# Patient Record
Sex: Female | Born: 1950 | Race: White | Hispanic: No | Marital: Married | Smoking: Never smoker
Health system: Southern US, Community
[De-identification: ages and names within clinical notes are randomized; demographics above are authoritative.]

## PROBLEM LIST (undated history)

## (undated) DIAGNOSIS — I10 Essential (primary) hypertension: Secondary | ICD-10-CM

## (undated) DIAGNOSIS — E785 Hyperlipidemia, unspecified: Secondary | ICD-10-CM

## (undated) DIAGNOSIS — Z9889 Other specified postprocedural states: Secondary | ICD-10-CM

## (undated) DIAGNOSIS — K219 Gastro-esophageal reflux disease without esophagitis: Secondary | ICD-10-CM

## (undated) HISTORY — DX: Hyperlipidemia, unspecified: E78.5

## (undated) HISTORY — DX: Gastro-esophageal reflux disease without esophagitis: K21.9

## (undated) HISTORY — DX: Other specified postprocedural states: Z98.890

## (undated) HISTORY — PX: KNEE ARTHROSCOPY W/ MENISCAL REPAIR: SHX1877

## (undated) HISTORY — PX: WISDOM TOOTH EXTRACTION: SHX21

## (undated) HISTORY — DX: Essential (primary) hypertension: I10

## (undated) HISTORY — PX: BLADDER SUSPENSION: SHX72

---

## 2000-12-11 ENCOUNTER — Encounter: Admission: RE | Admit: 2000-12-11 | Discharge: 2000-12-11 | Payer: Self-pay | Admitting: Family Medicine

## 2000-12-11 ENCOUNTER — Encounter: Payer: Self-pay | Admitting: Family Medicine

## 2002-07-07 ENCOUNTER — Encounter: Payer: Self-pay | Admitting: Family Medicine

## 2002-07-07 ENCOUNTER — Encounter: Admission: RE | Admit: 2002-07-07 | Discharge: 2002-07-07 | Payer: Self-pay | Admitting: Family Medicine

## 2003-10-31 ENCOUNTER — Ambulatory Visit (HOSPITAL_COMMUNITY): Admission: RE | Admit: 2003-10-31 | Discharge: 2003-10-31 | Payer: Self-pay | Admitting: Obstetrics & Gynecology

## 2004-10-02 ENCOUNTER — Encounter: Admission: RE | Admit: 2004-10-02 | Discharge: 2004-10-02 | Payer: Self-pay | Admitting: Gastroenterology

## 2004-11-18 ENCOUNTER — Ambulatory Visit (HOSPITAL_COMMUNITY): Admission: RE | Admit: 2004-11-18 | Discharge: 2004-11-18 | Payer: Self-pay | Admitting: Gastroenterology

## 2005-04-03 ENCOUNTER — Ambulatory Visit (HOSPITAL_COMMUNITY): Admission: RE | Admit: 2005-04-03 | Discharge: 2005-04-03 | Payer: Self-pay | Admitting: Family Medicine

## 2006-04-02 ENCOUNTER — Ambulatory Visit (HOSPITAL_COMMUNITY): Admission: RE | Admit: 2006-04-02 | Discharge: 2006-04-02 | Payer: Self-pay | Admitting: Family Medicine

## 2006-04-09 ENCOUNTER — Ambulatory Visit (HOSPITAL_COMMUNITY): Admission: RE | Admit: 2006-04-09 | Discharge: 2006-04-09 | Payer: Self-pay | Admitting: Family Medicine

## 2007-04-12 ENCOUNTER — Ambulatory Visit (HOSPITAL_COMMUNITY): Admission: RE | Admit: 2007-04-12 | Discharge: 2007-04-12 | Payer: Self-pay | Admitting: Family Medicine

## 2008-10-26 ENCOUNTER — Ambulatory Visit (HOSPITAL_COMMUNITY): Admission: RE | Admit: 2008-10-26 | Discharge: 2008-10-26 | Payer: Self-pay | Admitting: Family Medicine

## 2010-02-22 ENCOUNTER — Emergency Department (HOSPITAL_COMMUNITY): Admission: EM | Admit: 2010-02-22 | Discharge: 2010-02-23 | Payer: Self-pay | Admitting: Emergency Medicine

## 2010-02-28 ENCOUNTER — Encounter: Admission: RE | Admit: 2010-02-28 | Discharge: 2010-02-28 | Payer: Self-pay | Admitting: Family Medicine

## 2010-08-05 ENCOUNTER — Other Ambulatory Visit: Payer: Self-pay

## 2010-08-05 DIAGNOSIS — Z1231 Encounter for screening mammogram for malignant neoplasm of breast: Secondary | ICD-10-CM

## 2010-08-14 ENCOUNTER — Ambulatory Visit (HOSPITAL_COMMUNITY)
Admission: RE | Admit: 2010-08-14 | Discharge: 2010-08-14 | Disposition: A | Discharge status: Home / Self Care | Payer: BC Managed Care – PPO | Source: Ambulatory Visit | Attending: Family Medicine | Admitting: Family Medicine

## 2010-08-14 DIAGNOSIS — Z1231 Encounter for screening mammogram for malignant neoplasm of breast: Secondary | ICD-10-CM

## 2010-08-19 ENCOUNTER — Other Ambulatory Visit: Payer: Self-pay | Admitting: *Deleted

## 2010-08-19 DIAGNOSIS — R928 Other abnormal and inconclusive findings on diagnostic imaging of breast: Secondary | ICD-10-CM

## 2010-08-21 ENCOUNTER — Other Ambulatory Visit: Payer: Self-pay | Admitting: Family Medicine

## 2010-08-21 DIAGNOSIS — R928 Other abnormal and inconclusive findings on diagnostic imaging of breast: Secondary | ICD-10-CM

## 2010-08-27 ENCOUNTER — Ambulatory Visit
Admission: RE | Admit: 2010-08-27 | Discharge: 2010-08-27 | Disposition: A | Discharge status: Home / Self Care | Payer: BC Managed Care – PPO | Source: Ambulatory Visit | Attending: *Deleted | Admitting: *Deleted

## 2010-08-27 DIAGNOSIS — R928 Other abnormal and inconclusive findings on diagnostic imaging of breast: Secondary | ICD-10-CM

## 2010-09-13 LAB — COMPREHENSIVE METABOLIC PANEL
Alkaline Phosphatase: 60 U/L (ref 39–117)
BUN: 17 mg/dL (ref 6–23)
CO2: 24 mEq/L (ref 19–32)
GFR calc non Af Amer: 45 mL/min — ABNORMAL LOW (ref >60)
Total Bilirubin: 0.4 mg/dL (ref 0.3–1.2)
Total Protein: 6.8 g/dL (ref 6.0–8.3)

## 2010-09-13 LAB — POCT I-STAT, CHEM 8
Chloride: 106 mEq/L (ref 96–112)
Potassium: 3 mEq/L — ABNORMAL LOW (ref 3.5–5.1)
Sodium: 140 mEq/L (ref 135–145)

## 2010-09-13 LAB — CBC
HCT: 35.5 % — ABNORMAL LOW (ref 36.0–46.0)
Hemoglobin: 12.4 g/dL (ref 12.0–15.0)
MCHC: 35.3 g/dL (ref 30.0–36.0)
MCV: 88.8 fL (ref 78.0–100.0)
RBC: 3.95 MIL/uL (ref 3.87–5.11)
RDW: 12.2 % (ref 11.5–15.5)
WBC: 11.8 10*3/uL — ABNORMAL HIGH (ref 4.0–10.5)
WBC: 13.1 10*3/uL — ABNORMAL HIGH (ref 4.0–10.5)

## 2010-09-13 LAB — GLUCOSE, CAPILLARY: Glucose-Capillary: 142 mg/dL — ABNORMAL HIGH (ref 70–99)

## 2010-09-13 LAB — PROTIME-INR
INR: 0.91 (ref 0.00–1.49)
Prothrombin Time: 12.5 seconds (ref 11.6–15.2)

## 2010-09-13 LAB — DIFFERENTIAL
Basophils Absolute: 0 10*3/uL (ref 0.0–0.1)
Lymphocytes Relative: 18 % (ref 12–46)
Lymphs Abs: 2.3 10*3/uL (ref 0.7–4.0)
Monocytes Absolute: 0.8 10*3/uL (ref 0.1–1.0)
Neutro Abs: 10 10*3/uL — ABNORMAL HIGH (ref 1.7–7.7)

## 2010-09-13 LAB — LACTIC ACID, PLASMA: Lactic Acid, Venous: 2.5 mmol/L — ABNORMAL HIGH (ref 0.5–2.2)

## 2010-09-13 LAB — APTT: aPTT: 27 seconds (ref 24–37)

## 2010-11-01 ENCOUNTER — Other Ambulatory Visit (HOSPITAL_COMMUNITY): Payer: Self-pay | Admitting: Family Medicine

## 2010-11-01 DIAGNOSIS — Z1231 Encounter for screening mammogram for malignant neoplasm of breast: Secondary | ICD-10-CM

## 2010-11-15 NOTE — Op Note (Signed)
NAME:  Jenna Bentley, Jenna Bentley                ACCOUNT NO.:  [id number]   MEDICAL RECORD NO.:  [id number]          PATIENT TYPE:  AMB   LOCATION:  ENDO                         FACILITY:  [facility]   PHYSICIAN:  Anselmo Rod, M.D.  DATE OF BIRTH:  03-28-1951   DATE OF PROCEDURE:  11/18/2004  DATE OF DISCHARGE:                                 OPERATIVE REPORT   PROCEDURE PERFORMED:  Screening colonoscopy.   ENDOSCOPIST:  Charna Elizabeth, M.D.   INSTRUMENT USED:  Olympus video colonoscope.   INDICATIONS FOR PROCEDURE:  A 60 year old white female with a history of  change in bowel habits, abdominal pain undergoing screening colonoscopy to  rule out colonic polyps, masses, etc.   PREPROCEDURE PREPARATION:  Informed consent was procured from the patient.  The patient was fasted for eight hours prior to the procedure and prepped  with a bottle of magnesium citrate and a gallon of GoLYTELY the night prior  to the procedure.  The risks and benefits of the procedure including a 10%  miss rate for colon polyps or cancers was discussed with the patient as  well.   PREPROCEDURE PHYSICAL:  The patient had stable vital signs.  Neck supple.  Chest clear to auscultation.  S1 and S2 regular.  Abdomen soft with normal  bowel sounds.   DESCRIPTION OF PROCEDURE:  The patient was placed in left lateral decubitus  position and sedated with 75 mg of Demerol and 7.5 mg of Versed in slow  incremental doses.  Once the patient was adequately sedated and maintained  on low flow oxygen and continuous cardiac monitoring, the Olympus video  colonoscope was advanced from the rectum to the cecum.  The appendicular  orifice and ileocecal valve were clearly visualized and photographed.  The  terminal ileum appeared healthy and without lesions. Small internal  hemorrhoids were seen on retroflexion.  No masses, polyps, erosions,  ulcerations or diverticula were appreciated.  The patient tolerated the  procedure well without  complication.   IMPRESSION:  Normal colonoscopy up to terminal ileum except for small  internal hemorrhoids.  No masses, polyps or diverticula seen.   RECOMMENDATIONS:  1. Continue high fiber diet with liberal fluid intake.  2. Repeat colonoscopy in the next five years unless the patient develops      any abnormal symptoms in the interim.  3. Outpatient followup in the next two weeks or earlier if need be.        JNM/MEDQ  D:  11/18/2004  T:  11/18/2004  Job:  [id number]   cc:   Marjory Lies, M.D.  [address]  [place]  [place] [address]  Fax: [phone]

## 2011-02-26 ENCOUNTER — Other Ambulatory Visit: Payer: Self-pay | Admitting: Family Medicine

## 2011-02-26 DIAGNOSIS — N63 Unspecified lump in unspecified breast: Secondary | ICD-10-CM

## 2011-03-04 ENCOUNTER — Ambulatory Visit
Admission: RE | Admit: 2011-03-04 | Discharge: 2011-03-04 | Disposition: A | Discharge status: Home / Self Care | Payer: BC Managed Care – PPO | Source: Ambulatory Visit | Attending: Family Medicine | Admitting: Family Medicine

## 2011-03-04 DIAGNOSIS — N63 Unspecified lump in unspecified breast: Secondary | ICD-10-CM

## 2011-10-06 ENCOUNTER — Other Ambulatory Visit: Payer: Self-pay | Admitting: Family Medicine

## 2011-10-06 DIAGNOSIS — Z1231 Encounter for screening mammogram for malignant neoplasm of breast: Secondary | ICD-10-CM

## 2011-10-21 ENCOUNTER — Ambulatory Visit: Payer: BC Managed Care – PPO

## 2011-10-28 ENCOUNTER — Ambulatory Visit
Admission: RE | Admit: 2011-10-28 | Discharge: 2011-10-28 | Disposition: A | Discharge status: Home / Self Care | Payer: BC Managed Care – PPO | Source: Ambulatory Visit | Attending: Family Medicine | Admitting: Family Medicine

## 2011-10-28 DIAGNOSIS — Z1231 Encounter for screening mammogram for malignant neoplasm of breast: Secondary | ICD-10-CM

## 2013-01-03 ENCOUNTER — Other Ambulatory Visit: Payer: Self-pay

## 2013-01-03 DIAGNOSIS — Z1231 Encounter for screening mammogram for malignant neoplasm of breast: Secondary | ICD-10-CM

## 2013-01-26 ENCOUNTER — Ambulatory Visit
Admission: RE | Admit: 2013-01-26 | Discharge: 2013-01-26 | Disposition: A | Discharge status: Home / Self Care | Payer: BC Managed Care – PPO | Source: Ambulatory Visit

## 2013-01-26 DIAGNOSIS — Z1231 Encounter for screening mammogram for malignant neoplasm of breast: Secondary | ICD-10-CM

## 2013-06-30 HISTORY — PX: ROTATOR CUFF REPAIR: SHX139

## 2013-12-28 NOTE — Telephone Encounter (Signed)
This encounter was created in error - please disregard.

## 2014-03-09 ENCOUNTER — Other Ambulatory Visit: Payer: Self-pay

## 2014-03-09 DIAGNOSIS — Z1231 Encounter for screening mammogram for malignant neoplasm of breast: Secondary | ICD-10-CM

## 2014-03-22 ENCOUNTER — Encounter (INDEPENDENT_AMBULATORY_CARE_PROVIDER_SITE_OTHER): Payer: Self-pay

## 2014-03-22 ENCOUNTER — Ambulatory Visit
Admission: RE | Admit: 2014-03-22 | Discharge: 2014-03-22 | Disposition: A | Discharge status: Home / Self Care | Payer: BC Managed Care – PPO | Source: Ambulatory Visit

## 2014-03-22 DIAGNOSIS — Z1231 Encounter for screening mammogram for malignant neoplasm of breast: Secondary | ICD-10-CM

## 2014-05-16 ENCOUNTER — Ambulatory Visit: Payer: BC Managed Care – PPO | Attending: Orthopedic Surgery | Admitting: Physical Therapy

## 2014-05-16 DIAGNOSIS — M25519 Pain in unspecified shoulder: Secondary | ICD-10-CM | POA: Insufficient documentation

## 2014-05-16 DIAGNOSIS — M25619 Stiffness of unspecified shoulder, not elsewhere classified: Secondary | ICD-10-CM | POA: Insufficient documentation

## 2014-06-13 ENCOUNTER — Ambulatory Visit: Payer: BC Managed Care – PPO | Attending: Orthopedic Surgery | Admitting: Physical Therapy

## 2014-06-13 DIAGNOSIS — M25619 Stiffness of unspecified shoulder, not elsewhere classified: Secondary | ICD-10-CM | POA: Insufficient documentation

## 2014-06-13 DIAGNOSIS — M25519 Pain in unspecified shoulder: Secondary | ICD-10-CM | POA: Diagnosis not present

## 2014-06-16 ENCOUNTER — Ambulatory Visit: Payer: BC Managed Care – PPO | Admitting: Physical Therapy

## 2014-06-16 DIAGNOSIS — M25519 Pain in unspecified shoulder: Secondary | ICD-10-CM | POA: Diagnosis not present

## 2014-06-19 ENCOUNTER — Ambulatory Visit: Payer: BC Managed Care – PPO | Admitting: *Deleted

## 2014-06-19 DIAGNOSIS — M25519 Pain in unspecified shoulder: Secondary | ICD-10-CM | POA: Diagnosis not present

## 2014-06-21 ENCOUNTER — Ambulatory Visit: Payer: BC Managed Care – PPO | Admitting: *Deleted

## 2014-06-21 DIAGNOSIS — M25519 Pain in unspecified shoulder: Secondary | ICD-10-CM | POA: Diagnosis not present

## 2014-06-27 ENCOUNTER — Ambulatory Visit: Payer: BC Managed Care – PPO | Admitting: Physical Therapy

## 2014-06-27 DIAGNOSIS — M25519 Pain in unspecified shoulder: Secondary | ICD-10-CM | POA: Diagnosis not present

## 2014-06-29 ENCOUNTER — Ambulatory Visit: Payer: BC Managed Care – PPO | Admitting: *Deleted

## 2014-06-29 DIAGNOSIS — M25519 Pain in unspecified shoulder: Secondary | ICD-10-CM | POA: Diagnosis not present

## 2014-07-04 ENCOUNTER — Ambulatory Visit: Payer: BC Managed Care – PPO | Attending: Orthopedic Surgery | Admitting: Physical Therapy

## 2014-07-04 DIAGNOSIS — M25619 Stiffness of unspecified shoulder, not elsewhere classified: Secondary | ICD-10-CM | POA: Diagnosis not present

## 2014-07-04 DIAGNOSIS — M25519 Pain in unspecified shoulder: Secondary | ICD-10-CM | POA: Insufficient documentation

## 2014-07-06 ENCOUNTER — Encounter: Payer: BC Managed Care – PPO | Admitting: *Deleted

## 2014-07-06 ENCOUNTER — Ambulatory Visit: Payer: BC Managed Care – PPO | Admitting: Physical Therapy

## 2014-07-06 DIAGNOSIS — M25519 Pain in unspecified shoulder: Secondary | ICD-10-CM | POA: Diagnosis not present

## 2014-07-11 ENCOUNTER — Ambulatory Visit: Payer: BC Managed Care – PPO | Admitting: *Deleted

## 2014-07-11 DIAGNOSIS — M25519 Pain in unspecified shoulder: Secondary | ICD-10-CM | POA: Diagnosis not present

## 2014-07-13 ENCOUNTER — Encounter: Payer: BC Managed Care – PPO | Admitting: Physical Therapy

## 2014-07-17 ENCOUNTER — Ambulatory Visit: Payer: BC Managed Care – PPO | Admitting: Physical Therapy

## 2014-07-17 DIAGNOSIS — M25519 Pain in unspecified shoulder: Secondary | ICD-10-CM | POA: Diagnosis not present

## 2014-07-20 ENCOUNTER — Ambulatory Visit: Payer: BC Managed Care – PPO | Admitting: Physical Therapy

## 2014-07-20 DIAGNOSIS — M25519 Pain in unspecified shoulder: Secondary | ICD-10-CM | POA: Diagnosis not present

## 2014-07-26 ENCOUNTER — Ambulatory Visit: Payer: BC Managed Care – PPO | Admitting: Physical Therapy

## 2014-07-26 DIAGNOSIS — M25519 Pain in unspecified shoulder: Secondary | ICD-10-CM | POA: Diagnosis not present

## 2014-07-31 ENCOUNTER — Ambulatory Visit: Payer: BC Managed Care – PPO | Attending: Orthopedic Surgery | Admitting: Physical Therapy

## 2014-07-31 DIAGNOSIS — M25519 Pain in unspecified shoulder: Secondary | ICD-10-CM | POA: Insufficient documentation

## 2014-07-31 DIAGNOSIS — M25619 Stiffness of unspecified shoulder, not elsewhere classified: Secondary | ICD-10-CM | POA: Diagnosis not present

## 2014-08-03 ENCOUNTER — Ambulatory Visit: Payer: BC Managed Care – PPO | Admitting: Physical Therapy

## 2014-08-03 DIAGNOSIS — M25519 Pain in unspecified shoulder: Secondary | ICD-10-CM | POA: Diagnosis not present

## 2014-08-08 ENCOUNTER — Ambulatory Visit: Payer: BC Managed Care – PPO | Admitting: *Deleted

## 2014-08-08 DIAGNOSIS — M25519 Pain in unspecified shoulder: Secondary | ICD-10-CM | POA: Diagnosis not present

## 2014-08-10 ENCOUNTER — Ambulatory Visit: Payer: BC Managed Care – PPO | Admitting: *Deleted

## 2014-08-10 DIAGNOSIS — M25519 Pain in unspecified shoulder: Secondary | ICD-10-CM | POA: Diagnosis not present

## 2014-08-17 ENCOUNTER — Encounter: Payer: BC Managed Care – PPO | Admitting: *Deleted

## 2014-08-28 ENCOUNTER — Encounter: Payer: Self-pay | Admitting: Physical Therapy

## 2014-08-28 ENCOUNTER — Ambulatory Visit: Payer: BC Managed Care – PPO | Admitting: Physical Therapy

## 2014-08-28 DIAGNOSIS — S43401A Unspecified sprain of right shoulder joint, initial encounter: Secondary | ICD-10-CM

## 2014-08-28 DIAGNOSIS — M25519 Pain in unspecified shoulder: Secondary | ICD-10-CM | POA: Diagnosis not present

## 2014-08-28 DIAGNOSIS — M25611 Stiffness of right shoulder, not elsewhere classified: Secondary | ICD-10-CM

## 2014-08-28 DIAGNOSIS — Z9889 Other specified postprocedural states: Secondary | ICD-10-CM

## 2014-08-28 NOTE — Therapy (Signed)
[facility] [place], [place], [address] Phone: [phone]   Fax:  [phone]  Physical Therapy Treatment  Patient Details  Name: Jenna Bentley MRN: [id number] Date of Birth: Oct 14, 1950 Referring Provider:  Renette Butters, MD  Encounter Date: 08/28/2014      PT End of Session - 08/28/14 1309    Visit Number 18   Number of Visits 24   Date for PT Re-Evaluation 09/04/14   PT Start Time 1229   PT Stop Time 1314   PT Time Calculation (min) 45 min      History reviewed. No pertinent past medical history.  History reviewed. No pertinent past surgical history.  There were no vitals taken for this visit.  Visit Diagnosis:  Shoulder sprain, right, initial encounter  Shoulder stiffness, right  S/P rotator cuff repair      Subjective Assessment - 08/28/14 1239    Symptoms no pain or difficulty except for reaching behind back   Currently in Pain? No/denies          [facility] PT Assessment - 08/28/14 0001    ROM / Strength   AROM / PROM / Strength AROM;PROM   AROM   Overall AROM  Deficits   Overall AROM Comments (post RCR 06-13-14 88 degrees flex/15 degrees ER   AROM Assessment Site Shoulder   Right/Left Shoulder Right   Right Shoulder Flexion 150 Degrees   Right Shoulder External Rotation 60 Degrees   PROM   Overall PROM  Deficits   PROM Assessment Site Shoulder   Right/Left Shoulder Right   Right Shoulder Flexion 155 Degrees   Right Shoulder External Rotation 70 Degrees                  [facility] Adult PT Treatment/Exercise - 08/28/14 0001    Exercises   Exercises Shoulder   Shoulder Exercises: Pulleys   Flexion 3 minutes   Other Pulley Exercises UE ranger for elevation/circles 2 x 10 each   Shoulder Exercises: Isometric Strengthening   Flexion Theraband   Theraband Level (Flexion) Level 1 (Yellow)   Flexion Limitations 2x10   Extension Theraband   Theraband Level (Extension) Level 1  (Yellow)   Extension Limitations 2x10   External Rotation Theraband   Theraband Level (External Rotation) Level 1 (Yellow)   External Rotation Limitations 2x10   Internal Rotation Theraband   Theraband Level (Internal Rotation) Level 1 (Yellow)   Internal Rotation Limitations 2x10   Shoulder Exercises: Stretch   Corner Stretch 3 reps;30 seconds   Internal Rotation Stretch 20 seconds   Internal Rotation Stretch Limitations towel strech   Manual Therapy   Manual Therapy Passive ROM   Passive ROM low load hold with flex/er                PT Education - 08/28/14 1309    Education provided Yes   Education Details HEP/protocol   Person(s) Educated Patient   Methods Explanation;Demonstration;Handout   Comprehension Verbalized understanding;Returned demonstration             PT Long Term Goals - 08/28/14 1334    PT LONG TERM GOAL #1   Title demonstrate and/or verbalize techniques to reduce the risk of re-injury to include info on: anti-inflammatory (RICE Method)   Status Achieved  06-21-14   PT LONG TERM GOAL #2   Title be independent with advanced HEP   Status On-going  Plan - 08/28/14 1330    Clinical Impression Statement Pt tolerated tx well today with no pain, pt has been using arm actively will all ADL's, educated pt on protocol and limitations. pt understands HEP and protocol.   PT Treatment/Interventions Cryotherapy;Moist Heat;ADLs/Self Care Home Management;Therapeutic activities;Patient/family education;Therapeutic exercise;Passive range of motion;Ultrasound;Electrical Stimulation;Manual techniques   PT Next Visit Plan Cont with POC per PROTOCOL and get updated medication list from pt        Problem List There are no active problems to display for this patient.   Phillips Climes, PTA 08/28/2014, 1:57 PM  [facility] [address] [place], [place], [address] Phone: [phone]    Fax:  [phone]

## 2014-08-28 NOTE — Patient Instructions (Signed)
Strengthening: Resisted Flexion   Hold tubing with left arm at side. Pull forward and up. Move shoulder through pain-free range of motion. Repeat _10-20___ times per set. Do __1-2__ sets per session. Do __1-2__ sessions per day.  http://orth.exer.us/824   Copyright  VHI. All rights reserved.  Strengthening: Resisted Extension   Hold tubing in right hand, arm forward. Pull arm back, elbow straight. Repeat _10-20___ times per set. Do _1-2___ sets per session. Do __1-2__ sessions per day.  http://orth.exer.us/832   Copyright  VHI. All rights reserved.  Strengthening: Resisted External Rotation   Hold tubing in right hand, elbow at side and forearm across body. Rotate forearm out. Repeat __10-20__ times per set. Do __1-2__ sets per session. Do __1-2__ sessions per day.  http://orth.exer.us/828   Copyright  VHI. All rights reserved.  Strengthening: Resisted Internal Rotation   Hold tubing in left hand, elbow at side and forearm out. Rotate forearm in across body. Repeat _10-20___ times per set. Do __1-2__ sets per session. Do __1-2__ sessions per day.  http://orth.exer.us/830   Copyright  VHI. All rights reserved.  Flexibility: Corner Stretch   Standing in corner with hands just above shoulder level, lean forward until a comfortable stretch is felt across chest. Hold _20___ seconds. Repeat ___5-10_ times per set. Do _1-2___ sets per session. Do _1-2___ sessions per day.  http://orth.exer.us/342   Copyright  VHI. All rights reserved.  ROM: Towel Stretch - with Interior Rotation   Pull left arm up behind back by pulling towel up with other arm. Hold _20___ seconds. Repeat __5-0__ times per set. Do __1-2__ sets per session. Do __1-2__ sessions per day.  http://orth.exer.us/888   Copyright  VHI. All rights reserved.

## 2014-09-07 ENCOUNTER — Ambulatory Visit: Payer: BC Managed Care – PPO | Attending: Orthopedic Surgery | Admitting: Physical Therapy

## 2014-09-07 DIAGNOSIS — M25619 Stiffness of unspecified shoulder, not elsewhere classified: Secondary | ICD-10-CM | POA: Diagnosis not present

## 2014-09-07 DIAGNOSIS — R29898 Other symptoms and signs involving the musculoskeletal system: Secondary | ICD-10-CM

## 2014-09-07 DIAGNOSIS — M25519 Pain in unspecified shoulder: Secondary | ICD-10-CM | POA: Diagnosis not present

## 2014-09-07 NOTE — Therapy (Signed)
[facility] [place], [place], [address] Phone: [phone]   Fax:  [phone]  Physical Therapy Treatment  Patient Details  Name: Jenna Bentley MRN: [id number] Date of Birth: [birth year] Referring Provider:  Renette Butters, MD  Encounter Date: 09/07/2014      PT End of Session - 09/07/14 1340    PT Start Time 0102   PT Stop Time 0139   PT Time Calculation (min) 37 min      No past medical history on file.  No past surgical history on file.  There were no vitals filed for this visit.  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   Multiple Pain Sites No      There EX:  UBE @ 120 RPM's x 6 minutes Pulleys x 5 minutes UE Ranger x 5 minutes into flexion and ER  Manual:  12 minutes of left shoulder PROM into flexion, ER and IR.                             PT Long Term Goals - 08/28/14 1334    PT LONG TERM GOAL #1   Title demonstrate and/or verbalize techniques to reduce the risk of re-injury to include info on: anti-inflammatory (RICE Method)   Status Achieved  06-21-14   PT LONG TERM GOAL #2   Title be independent with advanced HEP   Status On-going   PT LONG TERM GOAL #3   Title Active shoulder flexion to 155 degrees   Time 4   Period Weeks   Status New   PT LONG TERM GOAL #4   Title Shoulder strength to 4+/5   Time 4   Period Weeks   Status New               Plan - 09/07/14 1347    PT Next Visit Plan UBE at 120 RPM's; PRE's.   Consulted and Agree with Plan of Care Patient        Problem List There are no active problems to display for this patient.   Roland Prine, [place] MPT 09/07/2014, 1:49 PM  [facility] [address] [place], [place], [address] Phone: [phone]   Fax:  [phone]

## 2014-09-12 ENCOUNTER — Encounter: Payer: Self-pay | Admitting: *Deleted

## 2014-09-12 ENCOUNTER — Ambulatory Visit: Payer: BC Managed Care – PPO | Admitting: *Deleted

## 2014-09-12 DIAGNOSIS — Z9889 Other specified postprocedural states: Secondary | ICD-10-CM

## 2014-09-12 DIAGNOSIS — R29898 Other symptoms and signs involving the musculoskeletal system: Secondary | ICD-10-CM

## 2014-09-12 DIAGNOSIS — S43401A Unspecified sprain of right shoulder joint, initial encounter: Secondary | ICD-10-CM

## 2014-09-12 DIAGNOSIS — M25611 Stiffness of right shoulder, not elsewhere classified: Secondary | ICD-10-CM

## 2014-09-12 DIAGNOSIS — M25519 Pain in unspecified shoulder: Secondary | ICD-10-CM | POA: Diagnosis not present

## 2014-09-12 NOTE — Therapy (Signed)
[facility] [place], [place], [address] Phone: [phone]   Fax:  [phone]  Physical Therapy Treatment  Patient Details  Name: Jenna Bentley MRN: [id number] Date of Birth: [birth year] Referring Provider:  Renette Butters, MD  Encounter Date: 09/12/2014      PT End of Session - 09/12/14 1217    Visit Number 20   Number of Visits 24   Date for PT Re-Evaluation 09/28/14   PT Start Time 2992   PT Stop Time 1131   PT Time Calculation (min) 59 min   Activity Tolerance Patient tolerated treatment well   Behavior During Therapy [facility] for tasks assessed/performed      History reviewed. No pertinent past medical history.  History reviewed. No pertinent past surgical history.  There were no vitals filed for this visit.  Visit Diagnosis:  Weakness of right arm  Shoulder sprain, right, initial encounter  Shoulder stiffness, right  S/P rotator cuff repair      Subjective Assessment - 09/12/14 1150    Symptoms Has been doing outside chores like raking with no pain.   Limitations Lifting   Currently in Pain? No/denies   Multiple Pain Sites No                       [facility] Adult PT Treatment/Exercise - 09/12/14 0001    Shoulder Exercises: Supine   Other Supine Exercises --  chest press 1 lb, supine 3 x 1o   Shoulder Exercises: Sidelying   External Rotation Weight (lbs) --  sdly 1lb external rotation .Marland KitchenMarland Kitchen3 x10   Theraband Level (Shoulder Flexion) Level 1 (Yellow)  Yellow T-band x 30 each ER,IR,protraction, retraction   Shoulder Exercises: Standing   Retraction --  bent rows 1 lb 3 x 10   Other Standing Exercises --  full can no wts....standing 3 x 10   Shoulder Exercises: Pulleys   Flexion --  seated pulley 3 min   Shoulder Exercises: ROM/Strengthening   UBE (Upper Arm Bike) --  6 min UBE 120 rpm 3 min forward, 3 back                     PT Long Term Goals - 08/28/14 1334    PT LONG TERM GOAL #1   Title demonstrate and/or verbalize techniques to reduce the risk of re-injury to include info on: anti-inflammatory (RICE Method)   Status Achieved  06-21-14   PT LONG TERM GOAL #2   Title be independent with advanced HEP   Status On-going   PT LONG TERM GOAL #3   Title Active shoulder flexion to 155 degrees   Time 4   Period Weeks   Status New   PT LONG TERM GOAL #4   Title Shoulder strength to 4+/5   Time 4   Period Weeks   Status New               Plan - 09/12/14 1221    Clinical Impression Statement Progressing with ROM. supine AROM flex 160. full ER......cont PRE and protocol   Pt will benefit from skilled therapeutic intervention in order to improve on the following deficits Decreased strength   Rehab Potential Excellent   PT Frequency 2x / week   PT Duration 8 weeks        Problem List There are no active problems to display for this patient.   Fabienne Bruns P,PTA 09/12/2014, 12:30 PM  [facility] [place], [place], [address] Phone: [phone]   Fax:  [phone]

## 2014-09-12 NOTE — Patient Instructions (Signed)
External Rotation: Single Arm (Cable)   Arm across body, rotate arm away from torso, keeping upper arm against body. Do ____ sets. Complete ____ repetitions.  http://st.exer.us/534   Copyright  VHI. All rights reserved.  External Rotation: Single Arm (Cable)   Arm across body, rotate arm away from torso, keeping upper arm against body. Do _3___ sets. Complete _10___ repetitions.  http://st.exer.us/534   Copyright  VHI. All rights reserved.

## 2014-09-21 ENCOUNTER — Ambulatory Visit: Payer: BC Managed Care – PPO | Admitting: *Deleted

## 2014-09-21 ENCOUNTER — Encounter: Payer: Self-pay | Admitting: *Deleted

## 2014-09-21 DIAGNOSIS — M25611 Stiffness of right shoulder, not elsewhere classified: Secondary | ICD-10-CM

## 2014-09-21 DIAGNOSIS — M25519 Pain in unspecified shoulder: Secondary | ICD-10-CM | POA: Diagnosis not present

## 2014-09-21 DIAGNOSIS — R29898 Other symptoms and signs involving the musculoskeletal system: Secondary | ICD-10-CM

## 2014-09-21 DIAGNOSIS — S43401A Unspecified sprain of right shoulder joint, initial encounter: Secondary | ICD-10-CM

## 2014-09-21 DIAGNOSIS — Z9889 Other specified postprocedural states: Secondary | ICD-10-CM

## 2014-09-21 NOTE — Therapy (Signed)
[facility] [place], [place], [address] Phone: [phone]   Fax:  [phone]  Physical Therapy Evaluation  Patient Details  Name: Jenna Bentley MRN: [id number] Date of Birth: Nov 11, 1950 Referring Provider:  Renette Butters, MD  Encounter Date: 09/21/2014      PT End of Session - 09/21/14 1156    Visit Number 21   Number of Visits 24   Date for PT Re-Evaluation 09/28/14   PT Start Time 1032   PT Stop Time 1127   PT Time Calculation (min) 55 min   Activity Tolerance Patient tolerated treatment well   Behavior During Therapy [facility] for tasks assessed/performed      History reviewed. No pertinent past medical history.  History reviewed. No pertinent past surgical history.  There were no vitals filed for this visit.  Visit Diagnosis:  Weakness of right arm  Shoulder sprain, right, initial encounter  Shoulder stiffness, right  S/P rotator cuff repair      Subjective Assessment - 09/21/14 1150    Symptoms Patient does not complain of pain.  She continues to rake and do yard chores without difficulty. She has the most difficulty reaching behind her back.   Limitations House hold activities   Currently in Pain? No/denies   Multiple Pain Sites No   Multiple Pain Sites No                                    PT Long Term Goals - 08/28/14 1334    PT LONG TERM GOAL #1   Title demonstrate and/or verbalize techniques to reduce the risk of re-injury to include info on: anti-inflammatory (RICE Method)   Status Achieved  06-21-14   PT LONG TERM GOAL #2   Title be independent with advanced HEP   Status On-going   PT LONG TERM GOAL #3   Title Active shoulder flexion to 155 degrees   Time 4   Period Weeks   Status New   PT LONG TERM GOAL #4   Title Shoulder strength to 4+/5   Time 4   Period Weeks   Status New               Plan - 09/21/14 1200    Clinical Impression  Statement Pt AROM supine...160.   full ER.... standing flex ...145.Marland KitchenMarland KitchenProgressing with strength per protocol   Pt will benefit from skilled therapeutic intervention in order to improve on the following deficits Decreased strength;Impaired UE functional use   Rehab Potential Excellent   PT Frequency 2x / week   PT Duration 8 weeks   PT Treatment/Interventions Therapeutic exercise   PT Next Visit Plan PRE per protocol   Consulted and Agree with Plan of Care Patient         Problem List There are no active problems to display for this patient.   Shanna Cisco 09/21/2014, 12:05 PM  [facility] [address] [place], [place], [address] Phone: [phone]   Fax:  [phone]

## 2014-09-26 ENCOUNTER — Ambulatory Visit: Payer: BC Managed Care – PPO | Admitting: Physical Therapy

## 2014-09-26 DIAGNOSIS — Z9889 Other specified postprocedural states: Secondary | ICD-10-CM

## 2014-09-26 DIAGNOSIS — M25519 Pain in unspecified shoulder: Secondary | ICD-10-CM | POA: Diagnosis not present

## 2014-09-26 DIAGNOSIS — R29898 Other symptoms and signs involving the musculoskeletal system: Secondary | ICD-10-CM

## 2014-09-26 DIAGNOSIS — M25611 Stiffness of right shoulder, not elsewhere classified: Secondary | ICD-10-CM

## 2014-09-26 NOTE — Patient Instructions (Signed)
ROM: Towel Stretch - with Interior Rotation   Pull left arm up behind back by pulling towel up with other arm. Hold _30___ seconds. Repeat _3___ times per set. Do _1___ sets per session. Do __2__ sessions per day.  http://orth.exer.us/888   Copyright  VHI. All rights reserved.

## 2014-09-26 NOTE — Therapy (Signed)
[facility] [place], [place], [address] Phone: [phone]   Fax:  [phone]  Physical Therapy Treatment  Patient Details  Name: Jenna Bentley MRN: [id number] Date of Birth: 09-May-1951 Referring Provider:  Renette Butters, MD  Encounter Date: 09/26/2014      PT End of Session - 09/26/14 1446    Visit Number 22   Number of Visits 24   Date for PT Re-Evaluation 09/28/14   PT Start Time 1428   PT Stop Time 1508   PT Time Calculation (min) 40 min   Activity Tolerance Patient tolerated treatment well   Behavior During Therapy [facility] for tasks assessed/performed      No past medical history on file.  No past surgical history on file.  There were no vitals filed for this visit.  Visit Diagnosis:  Weakness of right arm  Shoulder stiffness, right  S/P rotator cuff repair      Subjective Assessment - 09/26/14 1427    Symptoms Patient reports of no pain or soreness. Has appt with MD 09-28-14. Wishes for any exercises that could further her improvement. States that she did good with the weights at the last appointment.   Currently in Pain? No/denies                       [facility] Adult PT Treatment/Exercise - 09/26/14 0001    Exercises   Exercises Shoulder   Shoulder Exercises: Supine   Other Supine Exercises Supine chest press 2# 2 sets of 10 reps   Shoulder Exercises: Standing   Protraction Strengthening;Right;Theraband  30 reps   Theraband Level (Shoulder Protraction) Level 2 (Red)   External Rotation Strengthening;Right;Theraband  30 reps   Theraband Level (Shoulder External Rotation) Level 2 (Red)   Internal Rotation Strengthening;Right;Theraband  30 reps   Theraband Level (Shoulder Internal Rotation) Level 2 (Red)   Row Strengthening;Right;20 reps;Weights   Row Weight (lbs) 2   Retraction Strengthening;Right;Theraband  30 reps   Theraband Level (Shoulder Retraction) Level 2 (Red)   Other  Standing Exercises Full can 2# 20 reps   Shoulder Exercises: Pulleys   Flexion Other (comment)  8 minutes for cool-down   Shoulder Exercises: ROM/Strengthening   UBE (Upper Arm Bike) 90 RPMs x 8 min                PT Education - 09/26/14 1446    Education provided Yes   Education Details HEP: towel IR stretch. Continue other strengthening HEP. Given red theraband.   Person(s) Educated Patient   Methods Explanation;Demonstration;Handout   Comprehension Verbalized understanding;Returned demonstration;Verbal cues required             PT Long Term Goals - 09/26/14 1459    PT LONG TERM GOAL #1   Title demonstrate and/or verbalize techniques to reduce the risk of re-injury to include info on: anti-inflammatory (RICE Method)   Status Achieved   PT LONG TERM GOAL #2   Title be independent with advanced HEP   Status Achieved   PT LONG TERM GOAL #3   Title Active shoulder flexion to 155 degrees   Time 4   Period Weeks   Status On-going   PT LONG TERM GOAL #4   Title Shoulder strength to 4+/5   Time 4   Period Weeks   Status On-going               Plan - 09/26/14 1455    Clinical Impression  Statement Patient demonstrated mild R shoulder elevation during standing full can. Required external focus of attention and tactile cueing to decrease R shoulder compensation. Had no complaints of pain during treatment. Patient denied modalities and stated she would ice at home. Met independence with HEP goal today.    Pt will benefit from skilled therapeutic intervention in order to improve on the following deficits Decreased strength;Impaired UE functional use   Rehab Potential Excellent   PT Frequency 2x / week   PT Duration 8 weeks   PT Treatment/Interventions Therapeutic exercise   PT Next Visit Plan Continue per PT POC. Reassess ROM and strength next visit for MD appointment.   Consulted and Agree with Plan of Care Patient        Problem List There are no active  problems to display for this patient.   Wynelle Fanny, PTA 09/26/2014, 3:14 PM  [facility] [address] [place], [place], [address] Phone: [phone]   Fax:  [phone]

## 2014-09-28 ENCOUNTER — Ambulatory Visit: Payer: BC Managed Care – PPO | Admitting: Physical Therapy

## 2014-09-28 DIAGNOSIS — M25611 Stiffness of right shoulder, not elsewhere classified: Secondary | ICD-10-CM

## 2014-09-28 DIAGNOSIS — Z9889 Other specified postprocedural states: Secondary | ICD-10-CM

## 2014-09-28 DIAGNOSIS — R29898 Other symptoms and signs involving the musculoskeletal system: Secondary | ICD-10-CM

## 2014-09-28 DIAGNOSIS — M25519 Pain in unspecified shoulder: Secondary | ICD-10-CM | POA: Diagnosis not present

## 2014-09-28 NOTE — Therapy (Addendum)
[facility] [place], [place], [address] Phone: [phone]   Fax:  [phone]  Physical Therapy Treatment  Patient Details  Name: Jenna Bentley MRN: [id number] Date of Birth: 02/02/1951 Referring Provider:  Renette Butters, MD  Encounter Date: 09/28/2014    No past medical history on file.  No past surgical history on file.  There were no vitals filed for this visit.  Visit Diagnosis:  Weakness of right arm - Plan: PT plan of care cert/re-cert  Shoulder stiffness, right - Plan: PT plan of care cert/re-cert  S/P rotator cuff repair - Plan: PT plan of care cert/re-cert                                  PT Long Term Goals - 09/29/14 1226      PT LONG TERM GOAL #4   Title Shoulder strength a solid 5/5 in all motions.   Time 6   Period Weeks   Status New               Problem List There are no active problems to display for this patient.   APPLEGATE, [place], PTA 03/25/2016, 10:07 AM  [facility] [place], [place], [address] Phone: [phone]   Fax:  [phone]  PHYSICAL THERAPY DISCHARGE SUMMARY  Visits from Start of Care: 23.  Current functional level related to goals / functional outcomes: Please see above.   Remaining deficits: All goals achieved.   Education / Equipment: HEP. Plan: Patient agrees to discharge.  Patient goals were met. Patient is being discharged due to meeting the stated rehab goals.  ?????        [place] Applegate MPT

## 2014-09-29 NOTE — Addendum Note (Signed)
Addended by: Zi Newbury, [place] W on: 09/29/2014 12:28 PM   Modules accepted: Orders

## 2015-04-20 ENCOUNTER — Other Ambulatory Visit: Payer: Self-pay

## 2015-04-20 DIAGNOSIS — Z1231 Encounter for screening mammogram for malignant neoplasm of breast: Secondary | ICD-10-CM

## 2015-05-28 ENCOUNTER — Ambulatory Visit
Admission: RE | Admit: 2015-05-28 | Discharge: 2015-05-28 | Disposition: A | Discharge status: Home / Self Care | Payer: BC Managed Care – PPO | Source: Ambulatory Visit

## 2015-05-28 DIAGNOSIS — Z1231 Encounter for screening mammogram for malignant neoplasm of breast: Secondary | ICD-10-CM

## 2015-11-20 DIAGNOSIS — E785 Hyperlipidemia, unspecified: Secondary | ICD-10-CM | POA: Insufficient documentation

## 2015-11-20 DIAGNOSIS — I10 Essential (primary) hypertension: Secondary | ICD-10-CM | POA: Insufficient documentation

## 2015-11-20 DIAGNOSIS — F419 Anxiety disorder, unspecified: Secondary | ICD-10-CM | POA: Insufficient documentation

## 2015-11-20 DIAGNOSIS — K219 Gastro-esophageal reflux disease without esophagitis: Secondary | ICD-10-CM | POA: Insufficient documentation

## 2016-05-26 ENCOUNTER — Other Ambulatory Visit: Payer: Self-pay | Admitting: Family Medicine

## 2016-05-26 DIAGNOSIS — Z1231 Encounter for screening mammogram for malignant neoplasm of breast: Secondary | ICD-10-CM

## 2016-06-03 ENCOUNTER — Ambulatory Visit
Admission: RE | Admit: 2016-06-03 | Discharge: 2016-06-03 | Disposition: A | Discharge status: Home / Self Care | Payer: BC Managed Care – PPO | Source: Ambulatory Visit | Attending: Family Medicine | Admitting: Family Medicine

## 2016-06-03 DIAGNOSIS — Z1231 Encounter for screening mammogram for malignant neoplasm of breast: Secondary | ICD-10-CM

## 2017-05-07 ENCOUNTER — Other Ambulatory Visit: Payer: Self-pay | Admitting: Family Medicine

## 2017-05-07 DIAGNOSIS — Z1231 Encounter for screening mammogram for malignant neoplasm of breast: Secondary | ICD-10-CM

## 2017-06-09 ENCOUNTER — Ambulatory Visit: Payer: BC Managed Care – PPO

## 2017-07-07 ENCOUNTER — Ambulatory Visit: Payer: BC Managed Care – PPO

## 2017-10-20 ENCOUNTER — Ambulatory Visit
Admission: RE | Admit: 2017-10-20 | Discharge: 2017-10-20 | Disposition: A | Discharge status: Home / Self Care | Payer: Medicare Other | Source: Ambulatory Visit | Attending: Family Medicine | Admitting: Family Medicine

## 2017-10-20 DIAGNOSIS — Z1231 Encounter for screening mammogram for malignant neoplasm of breast: Secondary | ICD-10-CM

## 2018-12-02 ENCOUNTER — Other Ambulatory Visit: Payer: Self-pay | Admitting: Family Medicine

## 2018-12-02 DIAGNOSIS — Z1231 Encounter for screening mammogram for malignant neoplasm of breast: Secondary | ICD-10-CM

## 2019-01-20 ENCOUNTER — Ambulatory Visit
Admission: RE | Admit: 2019-01-20 | Discharge: 2019-01-20 | Disposition: A | Discharge status: Home / Self Care | Payer: Medicare Other | Source: Ambulatory Visit | Attending: Family Medicine | Admitting: Family Medicine

## 2019-01-20 ENCOUNTER — Other Ambulatory Visit: Payer: Self-pay

## 2019-01-20 DIAGNOSIS — Z1231 Encounter for screening mammogram for malignant neoplasm of breast: Secondary | ICD-10-CM

## 2019-07-01 HISTORY — PX: TOTAL KNEE ARTHROPLASTY: SHX125

## 2019-08-15 ENCOUNTER — Encounter: Payer: Self-pay | Admitting: Physical Therapy

## 2019-08-15 ENCOUNTER — Other Ambulatory Visit: Payer: Self-pay

## 2019-08-15 ENCOUNTER — Ambulatory Visit: Payer: Medicare HMO | Attending: Orthopedic Surgery | Admitting: Physical Therapy

## 2019-08-15 DIAGNOSIS — M25562 Pain in left knee: Secondary | ICD-10-CM | POA: Insufficient documentation

## 2019-08-15 DIAGNOSIS — M6281 Muscle weakness (generalized): Secondary | ICD-10-CM | POA: Diagnosis present

## 2019-08-15 DIAGNOSIS — R600 Localized edema: Secondary | ICD-10-CM

## 2019-08-15 DIAGNOSIS — M25662 Stiffness of left knee, not elsewhere classified: Secondary | ICD-10-CM | POA: Diagnosis present

## 2019-08-15 DIAGNOSIS — G8929 Other chronic pain: Secondary | ICD-10-CM | POA: Insufficient documentation

## 2019-08-15 NOTE — Therapy (Signed)
[facility] [place], [place], [address] Phone: [phone]   Fax:  [phone]  Physical Therapy Evaluation  Patient Details  Name: Jenna Bentley MRN: [id number] Date of Birth: [birth year] Referring Provider (PT): Edmonia Lynch MD   Encounter Date: 08/15/2019  PT End of Session - 08/15/19 1338    Visit Number  1    Number of Visits  16    Date for PT Re-Evaluation  10/10/19    Authorization Type  FOTO.    PT Start Time  1115    PT Stop Time  1203    PT Time Calculation (min)  48 min    Activity Tolerance  Patient tolerated treatment well    Behavior During Therapy  WFL for tasks assessed/performed       Past Medical History:  Diagnosis Date  . HTN (hypertension)   . S/P rotator cuff repair     History reviewed. No pertinent surgical history.  There were no vitals filed for this visit.   Subjective Assessment - 08/15/19 1342    Subjective  COVID-19 screen performed prior to patient entering clinic.  The patient underwent a left total knee replacement on last week.  She has been using a CPM 8 hours per day and cut down to 6 hours after beginning PT.  She is alos using a "Zero knee" for extension.  her pain at rest todat is a 4/10 that increase with standing, walking and range of motion.  She is using a walker for safe ambulation today.    Pertinent History  RCR, HTN.    Limitations  Walking;Standing    How long can you stand comfortably?  5 minutes.    How long can you walk comfortably?  Around house with walker.    Patient Stated Goals  Get back to normal.    Currently in Pain?  Yes    Pain Score  4     Pain Location  Knee    Pain Orientation  Left    Pain Descriptors / Indicators  Aching    Pain Type  Surgical pain    Pain Onset  In the past 7 days    Pain Frequency  Constant    Aggravating Factors   See above.    Pain Relieving Factors  See above.         [facility] PT Assessment - 08/15/19 0001       Assessment   Medical Diagnosis  Left total knee replacement    Referring Provider (PT)  Edmonia Lynch MD    Onset Date/Surgical Date  --   08/11/19 (surgery date).     Precautions   Precautions  --   No ultrasound.     Restrictions   Weight Bearing Restrictions  No      Balance Screen   Has the patient fallen in the past 6 months  No    Has the patient had a decrease in activity level because of a fear of falling?   Yes    Is the patient reluctant to leave their home because of a fear of falling?   Yes      [place] residence      Prior Function   Level of Independence  Independent      Observation/Other Assessments   Observations  Aquacel intact over left anterior knee.    Focus on Therapeutic Outcomes (FOTO)   79% limitation.  Observation/Other Assessments-Edema    Edema  --   Minimal+/moderate- left knee edema.     ROM / Strength   AROM / PROM / Strength  AROM;Strength      AROM   Overall AROM Comments  -8 degrees in supine and full passively with active flexion to 85 degrees and passive to 90 degrees.      Strength   Overall Strength Comments  Left hip= 2+/5, left knee extnesion= 3-/5.      Palpation   Palpation comment  Diffuse anterior knee pain currently.      Transfers   Comments  Assistance needed to move left LE.      Ambulation/Gait   Gait Comments  Patient is safely ambulating with a standard walker.                Objective measurements completed on examination: See above findings.      [facility] Adult PT Treatment/Exercise - 08/15/19 0001      Exercises   Exercises  Knee/Hip      Knee/Hip Exercises: Aerobic   Nustep  Level 1 x 10 minutes moving forward x 1 to increase knee flexion.      Modalities   Modalities  Vasopneumatic      Vasopneumatic   Number Minutes Vasopneumatic   10 minutes    Vasopnuematic Location   --   Left knee.   Vasopneumatic Pressure  Low                PT Short Term Goals - 08/15/19 1411      PT SHORT TERM GOAL #1   Title  STG's=LTG's.        PT Long Term Goals - 08/15/19 1412      PT LONG TERM GOAL #1   Time  8    Period  Weeks    Status  New      PT LONG TERM GOAL #2   Title  Independent with advanced HEP    Time  8    Period  Weeks    Status  New      PT LONG TERM GOAL #3   Title  Full active left knee extension in order to normalize gait.    Time  8    Period  Weeks    Status  New      PT LONG TERM GOAL #4   Title  Active knee flexion to 115 degrees+ so the patient can perform functional tasks and do so with pain not > 2-3/10.    Time  8    Period  Weeks    Status  New      PT LONG TERM GOAL #5   Title  Increase left knee strength to a solid 4+/5 to provide good stability for accomplishment of functional activities.    Time  8    Period  Weeks    Status  New      PT LONG TERM GOAL #6   Title  Perform a reciprocating stair gait with one railing with pain not > 2-3/10.    Time  8    Period  Weeks    Status  New             Plan - 08/15/19 1355    Clinical Impression Statement  The patient presents to OPPT s/p left total knee replacement performed on 08/12/19.  She has been using a CPM 8 hours per day.  She lacks full active  left knee extension but is full passive.  She was able to achieve 85 degrees of active left knee flexion adn passive to 90 degrees.  She lacks hip and knee stretch currently.  Her Aquacel is intact over her left knee incision.  Patient will benefit from skilled physical therapy intervention to address deficits and pain.    Personal Factors and Comorbidities  Comorbidity 1;Comorbidity 2    Comorbidities  RCR, HTN.    Examination-Activity Limitations  Locomotion Level;Stand;Stairs;Other    Examination-Participation Restrictions  Other    Stability/Clinical Decision Making  Stable/Uncomplicated    Clinical Decision Making  Low    Rehab Potential  Excellent    PT  Frequency  --   16 visits.   PT Treatment/Interventions  ADLs/Self Care Home Management;Cryotherapy;Electrical Stimulation;Moist Heat;Gait training;Stair training;Functional mobility training;Therapeutic activities;Therapeutic exercise;Neuromuscular re-education;Manual techniques;Patient/family education;Vasopneumatic Device    PT Next Visit Plan  Nustep, progress to bike when patient is ready, PROM, VMS to left quads, PRE's, Vasopnuematic.    Consulted and Agree with Plan of Care  Patient       Patient will benefit from skilled therapeutic intervention in order to improve the following deficits and impairments:  Decreased activity tolerance, Pain, Decreased strength, Decreased range of motion, Increased edema  Visit Diagnosis: Chronic pain of left knee - Plan: PT plan of care cert/re-cert  Stiffness of left knee, not elsewhere classified - Plan: PT plan of care cert/re-cert  Localized edema - Plan: PT plan of care cert/re-cert  Muscle weakness (generalized) - Plan: PT plan of care cert/re-cert     Problem List There are no problems to display for this patient.   Bascom Biel, [place] MPT 08/15/2019, 2:17 PM  [facility] [address] [place], [place], [address] Phone: [phone]   Fax:  [phone]  Name: Jenna Bentley MRN: [id number] Date of Birth: [birth year]

## 2019-08-17 ENCOUNTER — Other Ambulatory Visit: Payer: Self-pay

## 2019-08-17 ENCOUNTER — Ambulatory Visit: Payer: Medicare HMO | Admitting: Physical Therapy

## 2019-08-17 ENCOUNTER — Encounter: Payer: Self-pay | Admitting: Physical Therapy

## 2019-08-17 DIAGNOSIS — M25562 Pain in left knee: Secondary | ICD-10-CM

## 2019-08-17 DIAGNOSIS — M6281 Muscle weakness (generalized): Secondary | ICD-10-CM

## 2019-08-17 DIAGNOSIS — R600 Localized edema: Secondary | ICD-10-CM

## 2019-08-17 DIAGNOSIS — G8929 Other chronic pain: Secondary | ICD-10-CM

## 2019-08-17 DIAGNOSIS — M25662 Stiffness of left knee, not elsewhere classified: Secondary | ICD-10-CM

## 2019-08-17 NOTE — Therapy (Signed)
[facility] [place], [place], [address] Phone: [phone]   Fax:  [phone]  Physical Therapy Treatment  Patient Details  Name: Jenna Bentley MRN: [id number] Date of Birth: [birth year] Referring Provider (PT): Edmonia Lynch MD   Encounter Date: 08/17/2019  PT End of Session - 08/17/19 1439    Visit Number  2    Number of Visits  16    Date for PT Re-Evaluation  10/10/19    Authorization Type  FOTO.    PT Start Time  1431    PT Stop Time  1516    PT Time Calculation (min)  45 min    Activity Tolerance  Patient tolerated treatment well    Behavior During Therapy  WFL for tasks assessed/performed       Past Medical History:  Diagnosis Date  . HTN (hypertension)   . S/P rotator cuff repair     History reviewed. No pertinent surgical history.  There were no vitals filed for this visit.  Subjective Assessment - 08/17/19 1434    Subjective  COVID 19 screning performed on patient upon arrival. Reports that she had some soreness after evaluation. Has taken both prescription pain and muscle relaxer prior to PT. Has gotten up to 94 deg on CPM for 1 hour.    Pertinent History  RCR, HTN.    Limitations  Walking;Standing    How long can you stand comfortably?  5 minutes.    How long can you walk comfortably?  Around house with walker.    Patient Stated Goals  Get back to normal.    Currently in Pain?  Yes    Pain Score  3     Pain Location  Knee    Pain Orientation  Left    Pain Descriptors / Indicators  Discomfort    Pain Type  Surgical pain    Pain Onset  In the past 7 days    Pain Frequency  Constant         [facility] PT Assessment - 08/17/19 0001      Assessment   Medical Diagnosis  Left total knee replacement    Referring Provider (PT)  Edmonia Lynch MD    Onset Date/Surgical Date  08/11/19    Next MD Visit  08/24/2019      Restrictions   Weight Bearing Restrictions  No      ROM / Strength   AROM /  PROM / Strength  AROM      AROM   Overall AROM   Deficits    AROM Assessment Site  Knee    Right/Left Knee  Left    Left Knee Extension  5    Left Knee Flexion  102                   [facility] Adult PT Treatment/Exercise - 08/17/19 0001      Knee/Hip Exercises: Aerobic   Nustep  L2, seat 11-9 x15 min      Knee/Hip Exercises: Standing   Hip Flexion  AROM;Left;15 reps;Knee bent    Forward Lunges  Left;15 reps;5 seconds    Rocker Board  5 minutes      Knee/Hip Exercises: Supine   Short Arc Quad Sets  AROM;Left;15 reps      Modalities   Modalities  Vasopneumatic      Vasopneumatic   Number Minutes Vasopneumatic   10 minutes    Vasopnuematic Location   Knee  Vasopneumatic Pressure  Medium    Vasopneumatic Temperature   34               PT Short Term Goals - 08/15/19 1411      PT SHORT TERM GOAL #1   Title  STG's=LTG's.        PT Long Term Goals - 08/15/19 1412      PT LONG TERM GOAL #1   Time  8    Period  Weeks    Status  New      PT LONG TERM GOAL #2   Title  Independent with advanced HEP    Time  8    Period  Weeks    Status  New      PT LONG TERM GOAL #3   Title  Full active left knee extension in order to normalize gait.    Time  8    Period  Weeks    Status  New      PT LONG TERM GOAL #4   Title  Active knee flexion to 115 degrees+ so the patient can perform functional tasks and do so with pain not > 2-3/10.    Time  8    Period  Weeks    Status  New      PT LONG TERM GOAL #5   Title  Increase left knee strength to a solid 4+/5 to provide good stability for accomplishment of functional activities.    Time  8    Period  Weeks    Status  New      PT LONG TERM GOAL #6   Title  Perform a reciprocating stair gait with one railing with pain not > 2-3/10.    Time  8    Period  Weeks    Status  New            Plan - 08/17/19 1524    Clinical Impression Statement  Patient presented in clinic with reports of compliance of  HEP and minimal pain. Patient able to progress through ROM focused exercises without reports of pain. AROM of L knee meaured as 5-102 deg. TED hose present donned to LLE and use of FWW for ambulation. Normal modalties response noted following removal of the modalties.    Personal Factors and Comorbidities  Comorbidity 1;Comorbidity 2    Comorbidities  RCR, HTN.    Examination-Activity Limitations  Locomotion Level;Stand;Stairs;Other    Examination-Participation Restrictions  Other    Stability/Clinical Decision Making  Stable/Uncomplicated    Rehab Potential  Excellent    PT Treatment/Interventions  ADLs/Self Care Home Management;Cryotherapy;Electrical Stimulation;Moist Heat;Gait training;Stair training;Functional mobility training;Therapeutic activities;Therapeutic exercise;Neuromuscular re-education;Manual techniques;Patient/family education;Vasopneumatic Device    PT Next Visit Plan  Nustep, progress to bike when patient is ready, PROM, VMS to left quads, PRE's, Vasopnuematic.    Consulted and Agree with Plan of Care  Patient       Patient will benefit from skilled therapeutic intervention in order to improve the following deficits and impairments:  Decreased activity tolerance, Pain, Decreased strength, Decreased range of motion, Increased edema  Visit Diagnosis: Chronic pain of left knee  Stiffness of left knee, not elsewhere classified  Localized edema  Muscle weakness (generalized)     Problem List There are no problems to display for this patient.   Standley Brooking, PTA 08/17/2019, 3:27 PM  [facility] [address] [place], [place], [address] Phone: [phone]   Fax:  [phone]  Name:  Jenna Bentley MRN: [id number] Date of Birth: 1950-11-07

## 2019-08-19 ENCOUNTER — Encounter: Payer: BC Managed Care – PPO | Admitting: Physical Therapy

## 2019-08-22 ENCOUNTER — Ambulatory Visit: Payer: Medicare HMO | Admitting: Physical Therapy

## 2019-08-22 DIAGNOSIS — M25662 Stiffness of left knee, not elsewhere classified: Secondary | ICD-10-CM

## 2019-08-22 DIAGNOSIS — M6281 Muscle weakness (generalized): Secondary | ICD-10-CM

## 2019-08-22 DIAGNOSIS — M25562 Pain in left knee: Secondary | ICD-10-CM | POA: Diagnosis not present

## 2019-08-22 DIAGNOSIS — G8929 Other chronic pain: Secondary | ICD-10-CM

## 2019-08-22 DIAGNOSIS — R600 Localized edema: Secondary | ICD-10-CM

## 2019-08-22 NOTE — Therapy (Signed)
[facility] [place], [place], [address] Phone: [phone]   Fax:  [phone]  Physical Therapy Treatment  Patient Details  Name: Jenna Bentley MRN: [id number] Date of Birth: February 28, 1951 Referring Provider (PT): Edmonia Lynch MD   Encounter Date: 08/22/2019  PT End of Session - 08/22/19 1608    Visit Number  3    Number of Visits  16    Date for PT Re-Evaluation  10/10/19    Authorization Type  FOTO.    PT Start Time  0315    PT Stop Time  0409    PT Time Calculation (min)  54 min    Activity Tolerance  Patient tolerated treatment well    Behavior During Therapy  [facility] for tasks assessed/performed       Past Medical History:  Diagnosis Date  . HTN (hypertension)   . S/P rotator cuff repair     No past surgical history on file.  There were no vitals filed for this visit.  Subjective Assessment - 08/22/19 1604    Subjective  COVID-19 screen performed prior to patient entering clinic.  No new complaints.    Pertinent History  RCR, HTN.    Limitations  Walking;Standing    How long can you stand comfortably?  5 minutes.    How long can you walk comfortably?  Around house with walker.    Patient Stated Goals  Get back to normal.    Currently in Pain?  Yes    Pain Location  Knee    Pain Descriptors / Indicators  Discomfort    Pain Type  Surgical pain    Pain Onset  In the past 7 days                       [facility] Adult PT Treatment/Exercise - 08/22/19 0001      Exercises   Exercises  Knee/Hip      Knee/Hip Exercises: Aerobic   Recumbent Bike  Seat 5 x 5 minutes.    Nustep  level 3 x 10 minutes.      Knee/Hip Exercises: Supine   Short Arc Quad Sets Limitations  16 minutes facilitated with VMS to left quadriceps 10 sec extension holds and 10 sec rest.      Modalities   Modalities  Vasopneumatic      Vasopneumatic   Number Minutes Vasopneumatic   15 minutes    Vasopnuematic Location   --    Left knee.   Vasopneumatic Pressure  Low               PT Short Term Goals - 08/15/19 1411      PT SHORT TERM GOAL #1   Title  STG's=LTG's.        PT Long Term Goals - 08/15/19 1412      PT LONG TERM GOAL #1   Time  8    Period  Weeks    Status  New      PT LONG TERM GOAL #2   Title  Independent with advanced HEP    Time  8    Period  Weeks    Status  New      PT LONG TERM GOAL #3   Title  Full active left knee extension in order to normalize gait.    Time  8    Period  Weeks    Status  New      PT LONG  TERM GOAL #4   Title  Active knee flexion to 115 degrees+ so the patient can perform functional tasks and do so with pain not > 2-3/10.    Time  8    Period  Weeks    Status  New      PT LONG TERM GOAL #5   Title  Increase left knee strength to a solid 4+/5 to provide good stability for accomplishment of functional activities.    Time  8    Period  Weeks    Status  New      PT LONG TERM GOAL #6   Title  Perform a reciprocating stair gait with one railing with pain not > 2-3/10.    Time  8    Period  Weeks    Status  New            Plan - 08/22/19 1609    Clinical Impression Statement  The patient did very well today and was able to make forward revolutions on the bike at seat 5.  She did well with VMS to left quadriceps as well.    Personal Factors and Comorbidities  Comorbidity 1;Comorbidity 2    Comorbidities  RCR, HTN.    Examination-Activity Limitations  Locomotion Level;Stand;Stairs;Other    Stability/Clinical Decision Making  Stable/Uncomplicated    Rehab Potential  Excellent    PT Treatment/Interventions  ADLs/Self Care Home Management;Cryotherapy;Electrical Stimulation;Moist Heat;Gait training;Stair training;Functional mobility training;Therapeutic activities;Therapeutic exercise;Neuromuscular re-education;Manual techniques;Patient/family education;Vasopneumatic Device    PT Next Visit Plan  Nustep, progress to bike when patient is  ready, PROM, VMS to left quads, PRE's, Vasopnuematic.    Consulted and Agree with Plan of Care  Patient       Patient will benefit from skilled therapeutic intervention in order to improve the following deficits and impairments:  Decreased activity tolerance, Pain, Decreased strength, Decreased range of motion, Increased edema  Visit Diagnosis: Chronic pain of left knee  Localized edema  Stiffness of left knee, not elsewhere classified  Muscle weakness (generalized)     Problem List There are no problems to display for this patient.   Jenna Bentley, [place] MPT 08/22/2019, 4:21 PM  [facility] [address] [place], [place], [address] Phone: [phone]   Fax:  [phone]  Name: Jenna Bentley MRN: [id number] Date of Birth: 23-Jun-1951

## 2019-08-25 ENCOUNTER — Ambulatory Visit: Payer: Medicare HMO | Admitting: Physical Therapy

## 2019-08-25 ENCOUNTER — Other Ambulatory Visit: Payer: Self-pay

## 2019-08-25 DIAGNOSIS — G8929 Other chronic pain: Secondary | ICD-10-CM

## 2019-08-25 DIAGNOSIS — M25562 Pain in left knee: Secondary | ICD-10-CM | POA: Diagnosis not present

## 2019-08-25 DIAGNOSIS — R600 Localized edema: Secondary | ICD-10-CM

## 2019-08-25 DIAGNOSIS — M6281 Muscle weakness (generalized): Secondary | ICD-10-CM

## 2019-08-25 DIAGNOSIS — M25662 Stiffness of left knee, not elsewhere classified: Secondary | ICD-10-CM

## 2019-08-25 NOTE — Therapy (Signed)
[facility] [place], [place], [address] Phone: [phone]   Fax:  [phone]  Physical Therapy Treatment  Patient Details  Name: Jenna Bentley MRN: [id number] Date of Birth: 11-26-1950 Referring Provider (PT): Edmonia Lynch MD   Encounter Date: 08/25/2019  PT End of Session - 08/25/19 1434    Visit Number  5    Number of Visits  16    Date for PT Re-Evaluation  10/10/19    Authorization Type  FOTO.    PT Start Time  0137    PT Stop Time  0234    PT Time Calculation (min)  57 min    Activity Tolerance  Patient tolerated treatment well    Behavior During Therapy  [facility] for tasks assessed/performed       Past Medical History:  Diagnosis Date  . HTN (hypertension)   . S/P rotator cuff repair     No past surgical history on file.  There were no vitals filed for this visit.  Subjective Assessment - 08/25/19 1402    Subjective  COVID-19 screen performed prior to patient entering clinic.  Got Aquaacel off.  The PA was very pleased.  No more CPM after today.    Pertinent History  RCR, HTN.    Limitations  Walking;Standing    How long can you stand comfortably?  5 minutes.    How long can you walk comfortably?  Around house with walker.    Patient Stated Goals  Get back to normal.    Currently in Pain?  Yes    Pain Score  3     Pain Location  Knee    Pain Orientation  Left    Pain Descriptors / Indicators  Discomfort    Pain Type  Surgical pain    Pain Onset  In the past 7 days         [facility] PT Assessment - 08/25/19 0001      AROM   Overall AROM Comments  -3 degrees and 115 degrees of active left knee flexion and passive to 120 degrees.                   [facility] Adult PT Treatment/Exercise - 08/25/19 0001      Exercises   Exercises  Knee/Hip      Knee/Hip Exercises: Aerobic   Recumbent Bike  Seat 7 progressing to seat 5 x 15 minutes.      Knee/Hip Exercises: Supine   Short Arc Quad Sets  Limitations  16 minutes facilitated with VMS to left quadriceps (10 sec extension holds and 10 sec rest).      Modalities   Modalities  Health visitor Stimulation Location  Left knee.    Electrical Stimulation Action  IFC    Electrical Stimulation Parameters  1-10 Hz x 15 minutes.    Electrical Stimulation Goals  Edema;Pain      Vasopneumatic   Number Minutes Vasopneumatic   15 minutes    Vasopnuematic Location   --   Left knee.   Vasopneumatic Pressure  Medium               PT Short Term Goals - 08/15/19 1411      PT SHORT TERM GOAL #1   Title  STG's=LTG's.        PT Long Term Goals - 08/15/19 1412      PT LONG TERM GOAL #1  Time  8    Period  Weeks    Status  New      PT LONG TERM GOAL #2   Title  Independent with advanced HEP    Time  8    Period  Weeks    Status  New      PT LONG TERM GOAL #3   Title  Full active left knee extension in order to normalize gait.    Time  8    Period  Weeks    Status  New      PT LONG TERM GOAL #4   Title  Active knee flexion to 115 degrees+ so the patient can perform functional tasks and do so with pain not > 2-3/10.    Time  8    Period  Weeks    Status  New      PT LONG TERM GOAL #5   Title  Increase left knee strength to a solid 4+/5 to provide good stability for accomplishment of functional activities.    Time  8    Period  Weeks    Status  New      PT LONG TERM GOAL #6   Title  Perform a reciprocating stair gait with one railing with pain not > 2-3/10.    Time  8    Period  Weeks    Status  New            Plan - 08/25/19 1411    Clinical Impression Statement  Patient doing great.  Walking some now with a straight cane.  Aquacel now removed and left knee incisional site looks to be healing well.  Excellent progress with regards to increasing left knee range of motion.    Personal Factors and Comorbidities  Comorbidity 1;Comorbidity 2     Comorbidities  RCR, HTN.    Examination-Activity Limitations  Locomotion Level;Stand;Stairs;Other    Examination-Participation Restrictions  Other    Stability/Clinical Decision Making  Stable/Uncomplicated    Rehab Potential  Excellent    PT Treatment/Interventions  ADLs/Self Care Home Management;Cryotherapy;Electrical Stimulation;Moist Heat;Gait training;Stair training;Functional mobility training;Therapeutic activities;Therapeutic exercise;Neuromuscular re-education;Manual techniques;Patient/family education;Vasopneumatic Device    PT Next Visit Plan  Nustep, progress to bike when patient is ready, PROM, VMS to left quads, PRE's, Vasopnuematic.    Consulted and Agree with Plan of Care  Patient       Patient will benefit from skilled therapeutic intervention in order to improve the following deficits and impairments:  Decreased activity tolerance, Pain, Decreased strength, Decreased range of motion, Increased edema  Visit Diagnosis: Chronic pain of left knee  Localized edema  Stiffness of left knee, not elsewhere classified  Muscle weakness (generalized)     Problem List There are no problems to display for this patient.   Lenis Nettleton, [place] MPT 08/25/2019, 2:38 PM  [facility] [address] [place], [place], [address] Phone: [phone]   Fax:  [phone]  Name: Jenna Bentley MRN: [id number] Date of Birth: September 11, 1950

## 2019-08-29 ENCOUNTER — Encounter: Payer: Self-pay | Admitting: Physical Therapy

## 2019-08-29 ENCOUNTER — Ambulatory Visit: Payer: Medicare HMO | Attending: Orthopedic Surgery | Admitting: Physical Therapy

## 2019-08-29 ENCOUNTER — Other Ambulatory Visit: Payer: Self-pay

## 2019-08-29 DIAGNOSIS — G8929 Other chronic pain: Secondary | ICD-10-CM | POA: Diagnosis present

## 2019-08-29 DIAGNOSIS — M25562 Pain in left knee: Secondary | ICD-10-CM | POA: Insufficient documentation

## 2019-08-29 DIAGNOSIS — M6281 Muscle weakness (generalized): Secondary | ICD-10-CM

## 2019-08-29 DIAGNOSIS — M25662 Stiffness of left knee, not elsewhere classified: Secondary | ICD-10-CM

## 2019-08-29 DIAGNOSIS — R600 Localized edema: Secondary | ICD-10-CM | POA: Insufficient documentation

## 2019-08-29 NOTE — Therapy (Signed)
[facility] [place], [place], [address] Phone: [phone]   Fax:  [phone]  Physical Therapy Treatment  Patient Details  Name: Jenna Bentley MRN: [id number] Date of Birth: 1951/03/08 Referring Provider (PT): Edmonia Lynch MD   Encounter Date: 08/29/2019  PT End of Session - 08/29/19 1429    Visit Number  6    Number of Visits  16    Date for PT Re-Evaluation  10/10/19    Authorization Type  FOTO.    PT Start Time  1432    PT Stop Time  1521    PT Time Calculation (min)  49 min    Equipment Utilized During Treatment  Other (comment)   FWW   Activity Tolerance  Patient tolerated treatment well    Behavior During Therapy  WFL for tasks assessed/performed       Past Medical History:  Diagnosis Date  . HTN (hypertension)   . S/P rotator cuff repair     History reviewed. No pertinent surgical history.  There were no vitals filed for this visit.  Subjective Assessment - 08/29/19 1428    Subjective  COVID-19 screen performed prior to patient entering clinic. Reports she was going to try a cane today but states she has been more achey today. Reports 8/10 pain earlier today but took prescribed pain medication prior to PT.    Pertinent History  RCR, HTN.    Limitations  Walking;Standing    How long can you stand comfortably?  5 minutes.    How long can you walk comfortably?  Around house with walker.    Patient Stated Goals  Get back to normal.    Currently in Pain?  Yes    Pain Score  3     Pain Location  Knee    Pain Orientation  Left    Pain Descriptors / Indicators  Aching    Pain Type  Surgical pain    Pain Onset  1 to 4 weeks ago    Pain Frequency  Constant         [facility] PT Assessment - 08/29/19 0001      Assessment   Medical Diagnosis  Left total knee replacement    Referring Provider (PT)  Edmonia Lynch MD    Onset Date/Surgical Date  08/11/19    Next MD Visit  09/21/2019      Restrictions   Weight Bearing Restrictions  No      ROM / Strength   AROM / PROM / Strength  AROM      AROM   Overall AROM   Within functional limits for tasks performed    AROM Assessment Site  Knee    Right/Left Knee  Left    Left Knee Extension  0    Left Knee Flexion  119                   [facility] Adult PT Treatment/Exercise - 08/29/19 0001      Knee/Hip Exercises: Aerobic   Recumbent Bike  L3, seat 5 x15 min      Knee/Hip Exercises: Standing   Forward Lunges  Left;15 reps;3 seconds    Rocker Board  3 minutes      Knee/Hip Exercises: Supine   Short Arc Quad Sets  AROM;Left;2 sets;10 reps    Straight Leg Raises  AROM;Left;10 reps      Modalities   Modalities  Vasopneumatic      Vasopneumatic  Number Minutes Vasopneumatic   15 minutes    Vasopnuematic Location   Knee    Vasopneumatic Pressure  Medium    Vasopneumatic Temperature   34      Manual Therapy   Manual Therapy  Passive ROM    Passive ROM  PROM of L knee into flexion with holds at end range               PT Short Term Goals - 08/15/19 1411      PT SHORT TERM GOAL #1   Title  STG's=LTG's.        PT Long Term Goals - 08/15/19 1412      PT LONG TERM GOAL #1   Time  8    Period  Weeks    Status  New      PT LONG TERM GOAL #2   Title  Independent with advanced HEP    Time  8    Period  Weeks    Status  New      PT LONG TERM GOAL #3   Title  Full active left knee extension in order to normalize gait.    Time  8    Period  Weeks    Status  New      PT LONG TERM GOAL #4   Title  Active knee flexion to 115 degrees+ so the patient can perform functional tasks and do so with pain not > 2-3/10.    Time  8    Period  Weeks    Status  New      PT LONG TERM GOAL #5   Title  Increase left knee strength to a solid 4+/5 to provide good stability for accomplishment of functional activities.    Time  8    Period  Weeks    Status  New      PT LONG TERM GOAL #6   Title  Perform a reciprocating  stair gait with one railing with pain not > 2-3/10.    Time  8    Period  Weeks    Status  New            Plan - 08/29/19 1526    Clinical Impression Statement  Patient presented in clinic with reports of more achiness today and reports of pins and needles when trying to extend her knee in bed in order to fall asleept. Patient educated to attempt a pillow under L calf/foot to provide support or contact MD office for other suggestions. Patient guided through light strengthening and ROM exercises with greatest difficulty reported with SLRs. AROM measured as 0-119 deg in supine following therex and PROM. Normal modalities response noted following removal of the modalities.    Personal Factors and Comorbidities  Comorbidity 1;Comorbidity 2    Comorbidities  RCR, HTN.    Examination-Activity Limitations  Locomotion Level;Stand;Stairs;Other    Examination-Participation Restrictions  Other    Stability/Clinical Decision Making  Stable/Uncomplicated    Rehab Potential  Excellent    PT Treatment/Interventions  ADLs/Self Care Home Management;Cryotherapy;Electrical Stimulation;Moist Heat;Gait training;Stair training;Functional mobility training;Therapeutic activities;Therapeutic exercise;Neuromuscular re-education;Manual techniques;Patient/family education;Vasopneumatic Device    PT Next Visit Plan  Nustep, progress to bike when patient is ready, PROM, VMS to left quads, PRE's, Vasopnuematic.    Consulted and Agree with Plan of Care  Patient       Patient will benefit from skilled therapeutic intervention in order to improve the following deficits and impairments:  Decreased activity tolerance, Pain,  Decreased strength, Decreased range of motion, Increased edema  Visit Diagnosis: Chronic pain of left knee  Localized edema  Stiffness of left knee, not elsewhere classified  Muscle weakness (generalized)     Problem List There are no problems to display for this patient.   Standley Brooking, PTA 08/29/2019, 3:29 PM  [facility] [address] [place], [place], [address] Phone: [phone]   Fax:  [phone]  Name: Jenna Bentley MRN: [id number] Date of Birth: 1950/12/13

## 2019-09-01 ENCOUNTER — Encounter: Payer: Self-pay | Admitting: Physical Therapy

## 2019-09-01 ENCOUNTER — Ambulatory Visit: Payer: Medicare HMO | Admitting: Physical Therapy

## 2019-09-01 ENCOUNTER — Other Ambulatory Visit: Payer: Self-pay

## 2019-09-01 DIAGNOSIS — M6281 Muscle weakness (generalized): Secondary | ICD-10-CM

## 2019-09-01 DIAGNOSIS — M25562 Pain in left knee: Secondary | ICD-10-CM | POA: Diagnosis not present

## 2019-09-01 DIAGNOSIS — R600 Localized edema: Secondary | ICD-10-CM

## 2019-09-01 DIAGNOSIS — M25662 Stiffness of left knee, not elsewhere classified: Secondary | ICD-10-CM

## 2019-09-01 DIAGNOSIS — G8929 Other chronic pain: Secondary | ICD-10-CM

## 2019-09-01 NOTE — Therapy (Signed)
[facility] [place], [place], [address] Phone: [phone]   Fax:  [phone]  Physical Therapy Treatment  Patient Details  Name: Jenna Bentley MRN: [id number] Date of Birth: September 19, 1950 Referring Provider (PT): Edmonia Lynch MD   Encounter Date: 09/01/2019  PT End of Session - 09/01/19 1443    Visit Number  7    Number of Visits  16    Date for PT Re-Evaluation  10/10/19    Authorization Type  FOTO.    PT Start Time  1432    PT Stop Time  1520    PT Time Calculation (min)  48 min    Equipment Utilized During Treatment  Other (comment)   FWW- but carrying it   Activity Tolerance  Patient tolerated treatment well    Behavior During Therapy  WFL for tasks assessed/performed       Past Medical History:  Diagnosis Date  . HTN (hypertension)   . S/P rotator cuff repair     History reviewed. No pertinent surgical history.  There were no vitals filed for this visit.  Subjective Assessment - 09/01/19 1438    Subjective  COVID 19 screening performed on patient upon arrival. Patient reports she wanted to use cane into clinic today but her husband said no. Patient states that she called MD regarding pin and needles at night and they recommended prednisone and chiropractor. Has a chiropractor after today's treatment and is not interested in starting prednisone at this time.    Pertinent History  RCR, HTN.    Limitations  Walking;Standing    How long can you stand comfortably?  5 minutes.    How long can you walk comfortably?  Around house with walker.    Patient Stated Goals  Get back to normal.    Currently in Pain?  No/denies         [facility] PT Assessment - 09/01/19 0001      Assessment   Medical Diagnosis  Left total knee replacement    Referring Provider (PT)  Edmonia Lynch MD    Onset Date/Surgical Date  08/11/19    Next MD Visit  09/21/2019      Restrictions   Weight Bearing Restrictions  No      Observation/Other Assessments-Edema    Edema  Circumferential      Circumferential Edema   Circumferential - Right  47.3 cm    Circumferential - Left   48.1 cm      ROM / Strength   AROM / PROM / Strength  AROM;PROM      AROM   Overall AROM   Within functional limits for tasks performed    AROM Assessment Site  Knee    Right/Left Knee  Left    Left Knee Flexion  115      PROM   Overall PROM   Within functional limits for tasks performed    PROM Assessment Site  Knee    Right/Left Knee  Left    Left Knee Flexion  117                   [facility] Adult PT Treatment/Exercise - 09/01/19 0001      Knee/Hip Exercises: Aerobic   Recumbent Bike  L3, seat 5 x18 min      Knee/Hip Exercises: Standing   Forward Lunges  Left;20 reps   off 6" step   Hip Abduction  AROM;Both;20 reps;Knee straight    Forward Step Up  Left;2 sets;10 reps;Hand Hold: 2;Step Height: 6"    Functional Squat  15 reps      Knee/Hip Exercises: Seated   Long Arc Quad  Strengthening;Left;2 sets;10 reps;Weights    Long Arc Quad Weight  4 lbs.      Modalities   Modalities  Vasopneumatic      Vasopneumatic   Number Minutes Vasopneumatic   10 minutes    Vasopnuematic Location   Knee    Vasopneumatic Pressure  Medium    Vasopneumatic Temperature   34               PT Short Term Goals - 08/15/19 1411      PT SHORT TERM GOAL #1   Title  STG's=LTG's.        PT Long Term Goals - 08/15/19 1412      PT LONG TERM GOAL #1   Time  8    Period  Weeks    Status  New      PT LONG TERM GOAL #2   Title  Independent with advanced HEP    Time  8    Period  Weeks    Status  New      PT LONG TERM GOAL #3   Title  Full active left knee extension in order to normalize gait.    Time  8    Period  Weeks    Status  New      PT LONG TERM GOAL #4   Title  Active knee flexion to 115 degrees+ so the patient can perform functional tasks and do so with pain not > 2-3/10.    Time  8    Period  Weeks     Status  New      PT LONG TERM GOAL #5   Title  Increase left knee strength to a solid 4+/5 to provide good stability for accomplishment of functional activities.    Time  8    Period  Weeks    Status  New      PT LONG TERM GOAL #6   Title  Perform a reciprocating stair gait with one railing with pain not > 2-3/10.    Time  8    Period  Weeks    Status  New            Plan - 09/01/19 1512    Clinical Impression Statement  Patient presented in clinic with reports of stiffness and continued pins and needles at nighttime. Patient to start chiropractor for lumbar spine today. Patient guided through more functional strengthening exericses with education regarding initiating propriocpetive exercises as well. AROM and PROM measurements still WNL but less that last session. Patient encouraged to walk (with SPC[place] [employer] [place] for now as well as using rocking chair or swing to assist with reducing stiffness. Normal modalities response noted follwing removal of the modalities.    Personal Factors and Comorbidities  Comorbidity 1;Comorbidity 2    Comorbidities  RCR, HTN.    Examination-Activity Limitations  Locomotion Level;Stand;Stairs;Other    Examination-Participation Restrictions  Other    Stability/Clinical Decision Making  Stable/Uncomplicated    Rehab Potential  Excellent    PT Treatment/Interventions  ADLs/Self Care Home Management;Cryotherapy;Electrical Stimulation;Moist Heat;Gait training;Stair training;Functional mobility training;Therapeutic activities;Therapeutic exercise;Neuromuscular re-education;Manual techniques;Patient/family education;Vasopneumatic Device    PT Next Visit Plan  Nustep, progress to bike when patient is ready, PROM, VMS to left quads, PRE's, Vasopnuematic.    Consulted and Agree with Plan  of Care  Patient       Patient will benefit from skilled therapeutic intervention in order to improve the following deficits and impairments:  Decreased activity  tolerance, Pain, Decreased strength, Decreased range of motion, Increased edema  Visit Diagnosis: Chronic pain of left knee  Localized edema  Stiffness of left knee, not elsewhere classified  Muscle weakness (generalized)     Problem List There are no problems to display for this patient.   Standley Brooking, PTA 09/01/2019, 3:32 PM  [facility] [address] [place], [place], [address] Phone: [phone]   Fax:  [phone]  Name: Jenna RAYMON MRN: [id number] Date of Birth: [birth year]

## 2019-09-05 ENCOUNTER — Ambulatory Visit: Payer: Medicare HMO | Admitting: Physical Therapy

## 2019-09-05 ENCOUNTER — Other Ambulatory Visit: Payer: Self-pay

## 2019-09-05 DIAGNOSIS — M25662 Stiffness of left knee, not elsewhere classified: Secondary | ICD-10-CM

## 2019-09-05 DIAGNOSIS — M25562 Pain in left knee: Secondary | ICD-10-CM | POA: Diagnosis not present

## 2019-09-05 DIAGNOSIS — R600 Localized edema: Secondary | ICD-10-CM

## 2019-09-05 DIAGNOSIS — M6281 Muscle weakness (generalized): Secondary | ICD-10-CM

## 2019-09-05 DIAGNOSIS — G8929 Other chronic pain: Secondary | ICD-10-CM

## 2019-09-05 NOTE — Therapy (Signed)
[facility] [place], [place], [address] Phone: [phone]   Fax:  [phone]  Physical Therapy Treatment  Patient Details  Name: Jenna Bentley MRN: [id number] Date of Birth: 07/15/1950 Referring Provider (PT): Edmonia Lynch MD   Encounter Date: 09/05/2019  PT End of Session - 09/05/19 1418    Visit Number  8    Number of Visits  16    Date for PT Re-Evaluation  10/10/19    Authorization Type  FOTO.    PT Start Time  0134    PT Stop Time  0232    PT Time Calculation (min)  58 min    Activity Tolerance  Patient tolerated treatment well    Behavior During Therapy  WFL for tasks assessed/performed       Past Medical History:  Diagnosis Date  . HTN (hypertension)   . S/P rotator cuff repair     No past surgical history on file.  There were no vitals filed for this visit.  Subjective Assessment - 09/05/19 1352    Subjective  COVID-19 screen performed prior to patient entering clinic.  Doing good.    Pertinent History  RCR, HTN.    Limitations  Walking;Standing    How long can you stand comfortably?  5 minutes.         [facility] PT Assessment - 09/05/19 0001      AROM   AROM Assessment Site  Knee    Right/Left Knee  Left    Left Knee Flexion  115      PROM   Left Knee Flexion  120                   [facility] Adult PT Treatment/Exercise - 09/05/19 0001      Exercises   Exercises  Knee/Hip      Knee/Hip Exercises: Aerobic   Recumbent Bike  Level 1 moving forward to seat 1 x 12 minutes.    Nustep  Level 3 x 5 minutes.      Knee/Hip Exercises: Machines for Strengthening   Cybex Knee Extension  10# x 3 minutes    Cybex Knee Flexion  30# x 3 minutes.      Modalities   Modalities  Health visitor Stimulation Location  Left knee.    Electrical Stimulation Action  IFC    Electrical Stimulation Parameters  1-10 Hz x 20 minutes.      Vasopneumatic   Number Minutes Vasopneumatic   20 minutes    Vasopnuematic Location   --   Left knee.   Vasopneumatic Pressure  Medium               PT Short Term Goals - 08/15/19 1411      PT SHORT TERM GOAL #1   Title  STG's=LTG's.        PT Long Term Goals - 08/15/19 1412      PT LONG TERM GOAL #1   Time  8    Period  Weeks    Status  New      PT LONG TERM GOAL #2   Title  Independent with advanced HEP    Time  8    Period  Weeks    Status  New      PT LONG TERM GOAL #3   Title  Full active left knee extension in order to normalize gait.    Time  8    Period  Weeks    Status  New      PT LONG TERM GOAL #4   Title  Active knee flexion to 115 degrees+ so the patient can perform functional tasks and do so with pain not > 2-3/10.    Time  8    Period  Weeks    Status  New      PT LONG TERM GOAL #5   Title  Increase left knee strength to a solid 4+/5 to provide good stability for accomplishment of functional activities.    Time  8    Period  Weeks    Status  New      PT LONG TERM GOAL #6   Title  Perform a reciprocating stair gait with one railing with pain not > 2-3/10.    Time  8    Period  Weeks    Status  New            Plan - 09/05/19 1423    Clinical Impression Statement  The patient did great today.  Bike seat to 1 today.  FOTO score improved to 39% limitation.    Personal Factors and Comorbidities  Comorbidity 1;Comorbidity 2    Comorbidities  RCR, HTN.    Examination-Activity Limitations  Locomotion Level;Stand;Stairs;Other    Examination-Participation Restrictions  Other    Stability/Clinical Decision Making  Stable/Uncomplicated    Rehab Potential  Excellent    PT Treatment/Interventions  ADLs/Self Care Home Management;Cryotherapy;Electrical Stimulation;Moist Heat;Gait training;Stair training;Functional mobility training;Therapeutic activities;Therapeutic exercise;Neuromuscular re-education;Manual techniques;Patient/family  education;Vasopneumatic Device    PT Next Visit Plan  Nustep, progress to bike when patient is ready, PROM, VMS to left quads, PRE's, Vasopnuematic.    Consulted and Agree with Plan of Care  Patient       Patient will benefit from skilled therapeutic intervention in order to improve the following deficits and impairments:     Visit Diagnosis: Chronic pain of left knee  Localized edema  Stiffness of left knee, not elsewhere classified  Muscle weakness (generalized)     Problem List There are no problems to display for this patient.   Yadir Zentner, [place] MPT 09/05/2019, 2:37 PM  [facility] [address] [place], [place], [address] Phone: [phone]   Fax:  [phone]  Name: Jenna Bentley MRN: [id number] Date of Birth: 08/12/1950

## 2019-09-08 ENCOUNTER — Encounter: Payer: Self-pay | Admitting: Physical Therapy

## 2019-09-08 ENCOUNTER — Other Ambulatory Visit: Payer: Self-pay

## 2019-09-08 ENCOUNTER — Ambulatory Visit: Payer: Medicare HMO | Admitting: Physical Therapy

## 2019-09-08 DIAGNOSIS — M6281 Muscle weakness (generalized): Secondary | ICD-10-CM

## 2019-09-08 DIAGNOSIS — M25662 Stiffness of left knee, not elsewhere classified: Secondary | ICD-10-CM

## 2019-09-08 DIAGNOSIS — M25562 Pain in left knee: Secondary | ICD-10-CM | POA: Diagnosis not present

## 2019-09-08 DIAGNOSIS — R600 Localized edema: Secondary | ICD-10-CM

## 2019-09-08 DIAGNOSIS — G8929 Other chronic pain: Secondary | ICD-10-CM

## 2019-09-08 NOTE — Therapy (Signed)
[facility] [place], [place], [address] Phone: [phone]   Fax:  [phone]  Physical Therapy Treatment  Patient Details  Name: SERI TARPINIAN MRN: [id number] Date of Birth: 1950-08-02 Referring Provider (PT): Edmonia Lynch MD   Encounter Date: 09/08/2019  PT End of Session - 09/08/19 1425    Visit Number  9    Number of Visits  16    Date for PT Re-Evaluation  10/10/19    Authorization Type  FOTO.    PT Start Time  0145    PT Stop Time  0235    PT Time Calculation (min)  50 min    Activity Tolerance  Patient tolerated treatment well    Behavior During Therapy  WFL for tasks assessed/performed       Past Medical History:  Diagnosis Date  . HTN (hypertension)   . S/P rotator cuff repair     History reviewed. No pertinent surgical history.  There were no vitals filed for this visit.  Subjective Assessment - 09/08/19 1354    Subjective  COVID-19 screen performed prior to patient entering clinic.  Back pain today, knee ok    Pertinent History  RCR, HTN.    Limitations  Walking;Standing    How long can you stand comfortably?  5 minutes.    How long can you walk comfortably?  Around house with walker.    Patient Stated Goals  Get back to normal.    Currently in Pain?  No/denies         [facility] PT Assessment - 09/08/19 0001      AROM   AROM Assessment Site  Knee    Right/Left Knee  Left    Left Knee Extension  -2    Left Knee Flexion  110      PROM   Left Knee Extension  0    Left Knee Flexion  115                   [facility] Adult PT Treatment/Exercise - 09/08/19 0001      Knee/Hip Exercises: Aerobic   Nustep  L2 x65min      Knee/Hip Exercises: Machines for Strengthening   Cybex Knee Extension  10# 3x10    Cybex Knee Flexion  30# 3x10      Knee/Hip Exercises: Supine   Straight Leg Raises  Strengthening;Left;20 reps      Knee/Hip Exercises: Sidelying   Hip ABduction   Strengthening;Left;20 reps      Electrical Stimulation   Electrical Stimulation Location  Left knee.    Chartered certified accountant  IFC    Electrical Stimulation Parameters  1-10hz  x58min    Electrical Stimulation Goals  Edema;Pain      Vasopneumatic   Number Minutes Vasopneumatic   15 minutes    Vasopnuematic Location   Knee    Vasopneumatic Pressure  Medium    Vasopneumatic Temperature   34 for edema      Manual Therapy   Manual Therapy  Passive ROM    Passive ROM  manual PROM for left knee flexion/ext to improve mobility               PT Short Term Goals - 08/15/19 1411      PT SHORT TERM GOAL #1   Title  STG's=LTG's.        PT Long Term Goals - 09/08/19 1358      PT LONG TERM GOAL #2  Title  Independent with advanced HEP    Time  8    Period  Weeks    Status  On-going      PT LONG TERM GOAL #3   Title  Full active left knee extension in order to normalize gait.    Time  8    Period  Weeks    Status  On-going   AROM -2 degrees 09/08/19     PT LONG TERM GOAL #4   Title  Active knee flexion to 115 degrees+ so the patient can perform functional tasks and do so with pain not > 2-3/10.    Time  8    Period  Weeks    Status  On-going   AROM 110 degrees 09/08/19     PT LONG TERM GOAL #5   Title  Increase left knee strength to a solid 4+/5 to provide good stability for accomplishment of functional activities.    Time  8    Period  Weeks    Status  On-going      PT LONG TERM GOAL #6   Title  Perform a reciprocating stair gait with one railing with pain not > 2-3/10.    Time  8    Period  Weeks    Status  On-going            Plan - 09/08/19 1428    Clinical Impression Statement  Patient tolerated treatment well today. Patient able to progress with exercises today. Patient had some loss of ROM in knee today yet overall doing well with range. Patient has some soreness in knee yet not alot of pain. Patient progressing toward goals today.     Personal Factors and Comorbidities  Comorbidity 1;Comorbidity 2    Comorbidities  RCR, HTN.    Examination-Activity Limitations  Locomotion Level;Stand;Stairs;Other    Examination-Participation Restrictions  Other    Stability/Clinical Decision Making  Stable/Uncomplicated    Rehab Potential  Excellent    PT Treatment/Interventions  ADLs/Self Care Home Management;Cryotherapy;Electrical Stimulation;Moist Heat;Gait training;Stair training;Functional mobility training;Therapeutic activities;Therapeutic exercise;Neuromuscular re-education;Manual techniques;Patient/family education;Vasopneumatic Device    PT Next Visit Plan  cont with ROM/ PRE's, Vasopnuematic. MD follow up 09/21/19    Consulted and Agree with Plan of Care  Patient       Patient will benefit from skilled therapeutic intervention in order to improve the following deficits and impairments:  Decreased activity tolerance, Pain, Decreased strength, Decreased range of motion, Increased edema  Visit Diagnosis: Chronic pain of left knee  Localized edema  Stiffness of left knee, not elsewhere classified  Muscle weakness (generalized)     Problem List There are no problems to display for this patient.   Phillips Climes, PTA 09/08/2019, 2:37 PM  [facility] [place], [place], [address] Phone: [phone]   Fax:  [phone]  Name: CAMBRIA UMEDA MRN: [id number] Date of Birth: [birth year]

## 2019-09-12 ENCOUNTER — Ambulatory Visit: Payer: Medicare HMO | Admitting: Physical Therapy

## 2019-09-12 ENCOUNTER — Encounter: Payer: Self-pay | Admitting: Physical Therapy

## 2019-09-12 ENCOUNTER — Other Ambulatory Visit: Payer: Self-pay

## 2019-09-12 DIAGNOSIS — M6281 Muscle weakness (generalized): Secondary | ICD-10-CM

## 2019-09-12 DIAGNOSIS — M25562 Pain in left knee: Secondary | ICD-10-CM

## 2019-09-12 DIAGNOSIS — M25662 Stiffness of left knee, not elsewhere classified: Secondary | ICD-10-CM

## 2019-09-12 DIAGNOSIS — R600 Localized edema: Secondary | ICD-10-CM

## 2019-09-12 DIAGNOSIS — G8929 Other chronic pain: Secondary | ICD-10-CM

## 2019-09-12 NOTE — Therapy (Signed)
[facility] [place], [place], [address] Phone: [phone]   Fax:  [phone]  Physical Therapy Treatment  Patient Details  Name: Jenna Bentley MRN: [id number] Date of Birth: 03-09-1951 Referring Provider (PT): Edmonia Lynch MD   Encounter Date: 09/12/2019  PT End of Session - 09/12/19 1133    Visit Number  9    Number of Visits  16    Date for PT Re-Evaluation  10/10/19    Authorization Type  FOTO 8th visit 39% limitation    PT Start Time  1115    PT Stop Time  1202    PT Time Calculation (min)  47 min    Activity Tolerance  Patient tolerated treatment well    Behavior During Therapy  [facility] for tasks assessed/performed       Past Medical History:  Diagnosis Date  . HTN (hypertension)   . S/P rotator cuff repair     History reviewed. No pertinent surgical history.  There were no vitals filed for this visit.  Subjective Assessment - 09/12/19 1118    Subjective  COVID-19 screen performed prior to patient entering clinic.  No pain today, feeling good.    Pertinent History  RCR, HTN.    Limitations  Walking;Standing    How long can you stand comfortably?  5 minutes.    How long can you walk comfortably?  Around house with walker.    Patient Stated Goals  Get back to normal.    Currently in Pain?  No/denies         [facility] PT Assessment - 09/12/19 0001      AROM   AROM Assessment Site  Knee    Right/Left Knee  Left    Left Knee Extension  -2    Left Knee Flexion  113      PROM   PROM Assessment Site  Knee    Right/Left Knee  Left    Left Knee Extension  0    Left Knee Flexion  118                   [facility] Adult PT Treatment/Exercise - 09/12/19 0001      Knee/Hip Exercises: Aerobic   Nustep  L2 x67min      Knee/Hip Exercises: Machines for Strengthening   Cybex Knee Extension  10# 3x10    Cybex Knee Flexion  30# 3x10      Electrical Stimulation   Electrical Stimulation Location  Left  knee.    Chartered certified accountant  IFC    Electrical Stimulation Parameters  1-10hz  x4min    Electrical Stimulation Goals  Edema      Vasopneumatic   Number Minutes Vasopneumatic   15 minutes    Vasopnuematic Location   Knee    Vasopneumatic Pressure  Medium    Vasopneumatic Temperature   34 for edema      Manual Therapy   Manual Therapy  Passive ROM    Manual therapy comments  patella mobs in all directions    Passive ROM  manual PROM for left knee flexion/ext to improve mobility               PT Short Term Goals - 08/15/19 1411      PT SHORT TERM GOAL #1   Title  STG's=LTG's.        PT Long Term Goals - 09/12/19 1136      PT LONG TERM GOAL #2  Title  Independent with advanced HEP    Time  8    Period  Weeks    Status  On-going      PT LONG TERM GOAL #3   Title  Full active left knee extension in order to normalize gait.    Time  8    Period  Weeks    Status  On-going   AROM -2 degrees 09/12/19     PT LONG TERM GOAL #4   Title  Active knee flexion to 115 degrees+ so the patient can perform functional tasks and do so with pain not > 2-3/10.    Time  8    Period  Weeks    Status  On-going   AROM 113 degrees 09/12/19     PT LONG TERM GOAL #5   Title  Increase left knee strength to a solid 4+/5 to provide good stability for accomplishment of functional activities.    Time  8    Period  Weeks    Status  On-going      PT LONG TERM GOAL #6   Title  Perform a reciprocating stair gait with one railing with pain not > 2-3/10.    Time  8    Period  Weeks    Status  On-going            Plan - 09/12/19 1204    Clinical Impression Statement  Patient tolerated treatment well today. No pain upon arrival today and able to progress with current exericses with no difficulty. Patient has continued to improve with ROM in left knee for flexion and ext. Patient has reported being able to perform ADL's with greter ease. Goals ongoing at this time.     Personal Factors and Comorbidities  Comorbidity 1;Comorbidity 2    Comorbidities  RCR, HTN.    Examination-Activity Limitations  Locomotion Level;Stand;Stairs;Other    Examination-Participation Restrictions  Other    Stability/Clinical Decision Making  Stable/Uncomplicated    Rehab Potential  Excellent    PT Treatment/Interventions  ADLs/Self Care Home Management;Cryotherapy;Electrical Stimulation;Moist Heat;Gait training;Stair training;Functional mobility training;Therapeutic activities;Therapeutic exercise;Neuromuscular re-education;Manual techniques;Patient/family education;Vasopneumatic Device    PT Next Visit Plan  cont with ROM/ PRE's, Vasopnuematic. MD follow up 09/21/19    Consulted and Agree with Plan of Care  Patient       Patient will benefit from skilled therapeutic intervention in order to improve the following deficits and impairments:  Decreased activity tolerance, Pain, Decreased strength, Decreased range of motion, Increased edema  Visit Diagnosis: Chronic pain of left knee  Localized edema  Stiffness of left knee, not elsewhere classified  Muscle weakness (generalized)     Problem List There are no problems to display for this patient.  Ladean Raya, PTA 09/12/19 12:35 PM  [facility] [place], [place], [address] Phone: [phone]   Fax:  [phone]  Name: DNIYAH BERDUGO MRN: [id number] Date of Birth: 1951/01/03

## 2019-09-15 ENCOUNTER — Other Ambulatory Visit: Payer: Self-pay

## 2019-09-15 ENCOUNTER — Ambulatory Visit: Payer: Medicare HMO | Admitting: Physical Therapy

## 2019-09-15 ENCOUNTER — Encounter: Payer: Self-pay | Admitting: Physical Therapy

## 2019-09-15 DIAGNOSIS — R600 Localized edema: Secondary | ICD-10-CM

## 2019-09-15 DIAGNOSIS — M25562 Pain in left knee: Secondary | ICD-10-CM

## 2019-09-15 DIAGNOSIS — M25662 Stiffness of left knee, not elsewhere classified: Secondary | ICD-10-CM

## 2019-09-15 DIAGNOSIS — G8929 Other chronic pain: Secondary | ICD-10-CM

## 2019-09-15 DIAGNOSIS — M6281 Muscle weakness (generalized): Secondary | ICD-10-CM

## 2019-09-15 NOTE — Therapy (Signed)
[facility] [place], [place], [address] Phone: [phone]   Fax:  [phone]  Physical Therapy Treatment Progress Note Reporting Period 08/15/2019 to 09/15/2019  See note below for Objective Data and Assessment of Progress/Goals. Patient is progressing towards goals with minimal pain.      Patient Details  Name: Jenna Bentley MRN: [id number] Date of Birth: 08/01/1950 Referring Provider (PT): Edmonia Lynch MD   Encounter Date: 09/15/2019  PT End of Session - 09/15/19 1129    Visit Number  10    Number of Visits  16    Date for PT Re-Evaluation  10/10/19    Authorization Type  FOTO 8th visit 39% limitation    PT Start Time  1115    PT Stop Time  1206    PT Time Calculation (min)  51 min    Activity Tolerance  Patient tolerated treatment well    Behavior During Therapy  [facility] for tasks assessed/performed       Past Medical History:  Diagnosis Date  . HTN (hypertension)   . S/P rotator cuff repair     History reviewed. No pertinent surgical history.  There were no vitals filed for this visit.  Subjective Assessment - 09/15/19 1121    Subjective  COVID-19 screen performed prior to patient entering clinic.  Patient arrives with no reports of pain. Patient to see MD for follow up on 3/24/ 2021    Pertinent History  RCR, HTN.    Limitations  Walking;Standing    How long can you stand comfortably?  5 minutes.    How long can you walk comfortably?  Around house with walker.    Patient Stated Goals  Get back to normal.         [facility] PT Assessment - 09/15/19 0001      Assessment   Medical Diagnosis  Left total knee replacement    Referring Provider (PT)  Edmonia Lynch MD    Onset Date/Surgical Date  08/11/19    Next MD Visit  09/21/2019                   [facility] Adult PT Treatment/Exercise - 09/15/19 0001      Knee/Hip Exercises: Aerobic   Nustep  L2 x78min      Knee/Hip Exercises: Machines for  Strengthening   Cybex Knee Extension  10# 3x10; eccentric control x10    Cybex Knee Flexion  30# 3x10    Cybex Leg Press  1 plate 3x10      Electrical Stimulation   Electrical Stimulation Location  Left knee.    Electrical Stimulation Action  IFC    Electrical Stimulation Parameters  1-10 hz x15 mins    Electrical Stimulation Goals  Edema      Vasopneumatic   Number Minutes Vasopneumatic   15 minutes    Vasopnuematic Location   Knee    Vasopneumatic Pressure  Medium    Vasopneumatic Temperature   34 for edema               PT Short Term Goals - 08/15/19 1411      PT SHORT TERM GOAL #1   Title  STG's=LTG's.        PT Long Term Goals - 09/12/19 1136      PT LONG TERM GOAL #2   Title  Independent with advanced HEP    Time  8    Period  Weeks    Status  On-going      PT LONG TERM GOAL #3   Title  Full active left knee extension in order to normalize gait.    Time  8    Period  Weeks    Status  On-going   AROM -2 degrees 09/12/19     PT LONG TERM GOAL #4   Title  Active knee flexion to 115 degrees+ so the patient can perform functional tasks and do so with pain not > 2-3/10.    Time  8    Period  Weeks    Status  On-going   AROM 113 degrees 09/12/19     PT LONG TERM GOAL #5   Title  Increase left knee strength to a solid 4+/5 to provide good stability for accomplishment of functional activities.    Time  8    Period  Weeks    Status  On-going      PT LONG TERM GOAL #6   Title  Perform a reciprocating stair gait with one railing with pain not > 2-3/10.    Time  8    Period  Weeks    Status  On-going            Plan - 09/15/19 1135    Clinical Impression Statement  Patient responded well to therapy session. Patient was able to perform progressed strengthening TEs with minimal complaints and good form and technique. Patient reports ambulation with cane in community for safety but without AD at home.    Personal Factors and Comorbidities  Comorbidity  1;Comorbidity 2    Comorbidities  RCR, HTN.    Examination-Activity Limitations  Locomotion Level;Stand;Stairs;Other    Examination-Participation Restrictions  Other    Stability/Clinical Decision Making  Stable/Uncomplicated    Clinical Decision Making  Low    Rehab Potential  Excellent    PT Treatment/Interventions  ADLs/Self Care Home Management;Cryotherapy;Electrical Stimulation;Moist Heat;Gait training;Stair training;Functional mobility training;Therapeutic activities;Therapeutic exercise;Neuromuscular re-education;Manual techniques;Patient/family education;Vasopneumatic Device    PT Next Visit Plan  MD note, cont with ROM/ PRE's, Vasopnuematic. MD follow up 09/21/19    Consulted and Agree with Plan of Care  Patient       Patient will benefit from skilled therapeutic intervention in order to improve the following deficits and impairments:  Decreased activity tolerance, Pain, Decreased strength, Decreased range of motion, Increased edema  Visit Diagnosis: Chronic pain of left knee  Localized edema  Stiffness of left knee, not elsewhere classified  Muscle weakness (generalized)     Problem List There are no problems to display for this patient.   Gabriela Eves, PT, DPT 09/15/2019, 12:10 PM  [facility] [address] [place], [place], [address] Phone: [phone]   Fax:  [phone]  Name: Jenna Bentley MRN: [id number] Date of Birth: Jun 18, 1951

## 2019-09-20 ENCOUNTER — Other Ambulatory Visit: Payer: Self-pay

## 2019-09-20 ENCOUNTER — Ambulatory Visit: Payer: Medicare HMO | Admitting: Physical Therapy

## 2019-09-20 ENCOUNTER — Encounter: Payer: Self-pay | Admitting: Physical Therapy

## 2019-09-20 DIAGNOSIS — M25662 Stiffness of left knee, not elsewhere classified: Secondary | ICD-10-CM

## 2019-09-20 DIAGNOSIS — M25562 Pain in left knee: Secondary | ICD-10-CM | POA: Diagnosis not present

## 2019-09-20 DIAGNOSIS — M6281 Muscle weakness (generalized): Secondary | ICD-10-CM

## 2019-09-20 DIAGNOSIS — R600 Localized edema: Secondary | ICD-10-CM

## 2019-09-20 NOTE — Therapy (Signed)
[facility] [place], [place], [address] Phone: [phone]   Fax:  [phone]  Physical Therapy Treatment  Patient Details  Name: Jenna Bentley MRN: [id number] Date of Birth: 09-01-1950 Referring Provider (PT): Edmonia Lynch MD   Encounter Date: 09/20/2019  PT End of Session - 09/20/19 1351    Visit Number  11    Number of Visits  16    Date for PT Re-Evaluation  10/10/19    Authorization Type  FOTO 8th visit 39% limitation    PT Start Time  1345    PT Stop Time  1447    PT Time Calculation (min)  62 min    Activity Tolerance  Patient tolerated treatment well    Behavior During Therapy  [facility] for tasks assessed/performed       Past Medical History:  Diagnosis Date  . HTN (hypertension)   . S/P rotator cuff repair     History reviewed. No pertinent surgical history.  There were no vitals filed for this visit.  Subjective Assessment - 09/20/19 1351    Subjective  COVID-19 screen performed prior to patient entering clinic.  Patient reports doing well with no pain.    Pertinent History  RCR, HTN.    Limitations  Walking;Standing    How long can you stand comfortably?  5 minutes.    How long can you walk comfortably?  Around house with walker.    Patient Stated Goals  Get back to normal.    Currently in Pain?  No/denies         [facility] PT Assessment - 09/20/19 0001      Assessment   Medical Diagnosis  Left total knee replacement    Referring Provider (PT)  Edmonia Lynch MD    Onset Date/Surgical Date  08/11/19    Next MD Visit  09/21/2019      AROM   Left Knee Extension  0    Left Knee Flexion  115      PROM   Left Knee Extension  0    Left Knee Flexion  120      Strength   Strength Assessment Site  Knee    Right/Left Knee  Left    Left Knee Flexion  4+/5    Left Knee Extension  4+/5                   [facility] Adult PT Treatment/Exercise - 09/20/19 0001      Ambulation/Gait   Stairs   Yes    Stairs Assistance  7: Independent    Stair Management Technique  One rail Left;Alternating pattern    Number of Stairs  4    Height of Stairs  6.5      Knee/Hip Exercises: Aerobic   Nustep  L2 x87mn      Knee/Hip Exercises: Machines for Strengthening   Cybex Knee Extension  10# 3x10; eccentric control x10    Cybex Knee Flexion  40# 4x10    Cybex Leg Press  1 plate 3x10      Electrical Stimulation   Electrical Stimulation Location  Left knee.    Electrical Stimulation Action  IFC    Electrical Stimulation Parameters  1-10 hz x15 mins    Electrical Stimulation Goals  Edema      Vasopneumatic   Number Minutes Vasopneumatic   15 minutes    Vasopnuematic Location   Knee    Vasopneumatic Pressure  Medium  Vasopneumatic Temperature   34 for edema      Manual Therapy   Manual Therapy  Passive ROM    Manual therapy comments  patella mobs in all directions    Passive ROM  manual PROM for left knee flexion/ext to improve mobility               PT Short Term Goals - 08/15/19 1411      PT SHORT TERM GOAL #1   Title  STG's=LTG's.        PT Long Term Goals - 09/20/19 1352      PT LONG TERM GOAL #2   Title  Independent with advanced HEP    Time  8    Period  Weeks    Status  Achieved      PT LONG TERM GOAL #3   Title  Full active left knee extension in order to normalize gait.    Time  8    Period  Weeks    Status  Achieved      PT LONG TERM GOAL #4   Title  Active knee flexion to 115 degrees+ so the patient can perform functional tasks and do so with pain not > 2-3/10.    Time  8    Period  Weeks    Status  Achieved      PT LONG TERM GOAL #5   Title  Increase left knee strength to a solid 4+/5 to provide good stability for accomplishment of functional activities.    Status  Achieved      PT LONG TERM GOAL #6   Title  Perform a reciprocating stair gait with one railing with pain not > 2-3/10.    Time  8    Period  Weeks    Status  Partially Met    able to ascend reciprocally; step to step descending           Plan - 09/20/19 1512    Clinical Impression Statement  Patient responded well to therapy session with just reports of soreness in left knee secondary to muscle fatigue. Patient discussed the ability to ascend steps reciprocally but stated going down is a step to pattern with a combination of fear and habit as she has not descended stesp with reciprocating pattern in years. Goals are partially met. Patient to see MD for folow up tomorrow. FOTO limitation 27%. No adverse affects upon removal of modalities.    Personal Factors and Comorbidities  Comorbidity 1;Comorbidity 2    Comorbidities  RCR, HTN.    Examination-Activity Limitations  Locomotion Level;Stand;Stairs;Other    Examination-Participation Restrictions  Other    Stability/Clinical Decision Making  Stable/Uncomplicated    Clinical Decision Making  Low    Rehab Potential  Excellent    PT Treatment/Interventions  ADLs/Self Care Home Management;Cryotherapy;Electrical Stimulation;Moist Heat;Gait training;Stair training;Functional mobility training;Therapeutic activities;Therapeutic exercise;Neuromuscular re-education;Manual techniques;Patient/family education;Vasopneumatic Device    PT Next Visit Plan  cont with ROM/ PRE's, Vasopnuematic. eccentric strengthening of left knee    Consulted and Agree with Plan of Care  Patient       Patient will benefit from skilled therapeutic intervention in order to improve the following deficits and impairments:  Decreased activity tolerance, Pain, Decreased strength, Decreased range of motion, Increased edema  Visit Diagnosis: Localized edema  Stiffness of left knee, not elsewhere classified  Muscle weakness (generalized)     Problem List There are no problems to display for this patient.   Gabriela Eves, PT, DPT  09/20/2019, 3:19 PM  [facility] [place], [place], [address] Phone: [phone]   Fax:  [phone]  Name: Jenna Bentley MRN: [id number] Date of Birth: 1951-02-01

## 2019-09-26 ENCOUNTER — Other Ambulatory Visit: Payer: Self-pay

## 2019-09-26 ENCOUNTER — Ambulatory Visit: Payer: Medicare HMO | Admitting: Physical Therapy

## 2019-09-26 ENCOUNTER — Encounter: Payer: Self-pay | Admitting: Physical Therapy

## 2019-09-26 DIAGNOSIS — M25562 Pain in left knee: Secondary | ICD-10-CM | POA: Diagnosis not present

## 2019-09-26 DIAGNOSIS — M6281 Muscle weakness (generalized): Secondary | ICD-10-CM

## 2019-09-26 DIAGNOSIS — R600 Localized edema: Secondary | ICD-10-CM

## 2019-09-26 DIAGNOSIS — M25662 Stiffness of left knee, not elsewhere classified: Secondary | ICD-10-CM

## 2019-09-26 NOTE — Therapy (Signed)
[facility] [place], [place], [address] Phone: [phone]   Fax:  [phone]  Physical Therapy Treatment  Patient Details  Name: Jenna Bentley MRN: [id number] Date of Birth: 11-09-1950 Referring Provider (PT): Edmonia Lynch MD   Encounter Date: 09/26/2019  PT End of Session - 09/26/19 1435    Visit Number  12    Number of Visits  16    Date for PT Re-Evaluation  10/10/19    Authorization Type  FOTO 8th visit 39% limitation    PT Start Time  1432    PT Stop Time  1519    PT Time Calculation (min)  47 min    Activity Tolerance  Patient tolerated treatment well    Behavior During Therapy  [facility] for tasks assessed/performed       Past Medical History:  Diagnosis Date  . HTN (hypertension)   . S/P rotator cuff repair     History reviewed. No pertinent surgical history.  There were no vitals filed for this visit.  Subjective Assessment - 09/26/19 1434    Subjective  COVID-19 screen performed prior to patient entering clinic.  Patient reports that MD was very pleased and approved for her to fiinish her PT visits.    Pertinent History  RCR, HTN.    Limitations  Walking;Standing    How long can you stand comfortably?  5 minutes.    How long can you walk comfortably?  Around house with walker.    Patient Stated Goals  Get back to normal.    Currently in Pain?  No/denies         [facility] PT Assessment - 09/26/19 0001      Assessment   Medical Diagnosis  Left total knee replacement    Referring Provider (PT)  Edmonia Lynch MD    Onset Date/Surgical Date  08/11/19                   [facility] Adult PT Treatment/Exercise - 09/26/19 0001      Knee/Hip Exercises: Aerobic   Recumbent Bike  L3, seat 2 x15 min      Knee/Hip Exercises: Machines for Strengthening   Cybex Knee Extension  20# 3x10 reps    Cybex Knee Flexion  40# 3x10 reps    Cybex Leg Press  1.5 plate 3x10 reps      Knee/Hip Exercises:  Standing   Terminal Knee Extension  Strengthening;Left;2 sets;10 reps;Theraband    Theraband Level (Terminal Knee Extension)  Level 3 (Green)    Hip Abduction  Stengthening;Left;20 reps;Knee straight;Limitations    Abduction Limitations  green theraband    Forward Step Up  Left;2 sets;10 reps;Hand Hold: 2;Step Height: 8"      Modalities   Modalities  Vasopneumatic      Vasopneumatic   Number Minutes Vasopneumatic   10 minutes    Vasopnuematic Location   Knee    Vasopneumatic Pressure  Low    Vasopneumatic Temperature   34 for edema               PT Short Term Goals - 08/15/19 1411      PT SHORT TERM GOAL #1   Title  STG's=LTG's.        PT Long Term Goals - 09/20/19 1352      PT LONG TERM GOAL #2   Title  Independent with advanced HEP    Time  8    Period  Weeks  Status  Achieved      PT LONG TERM GOAL #3   Title  Full active left knee extension in order to normalize gait.    Time  8    Period  Weeks    Status  Achieved      PT LONG TERM GOAL #4   Title  Active knee flexion to 115 degrees+ so the patient can perform functional tasks and do so with pain not > 2-3/10.    Time  8    Period  Weeks    Status  Achieved      PT LONG TERM GOAL #5   Title  Increase left knee strength to a solid 4+/5 to provide good stability for accomplishment of functional activities.    Status  Achieved      PT LONG TERM GOAL #6   Title  Perform a reciprocating stair gait with one railing with pain not > 2-3/10.    Time  8    Period  Weeks    Status  Partially Met   able to ascend reciprocally; step to step descending           Plan - 09/26/19 1532    Clinical Impression Statement  Patient presented in clinic with no L knee pain. Patient very encouraged by machine strengthening as she feels improvement. Patient required encouragement for forward step ups on LLE. No compensatory strategies noted during L forward step ups. Normal modalities response noted following  removal of the modalities.    Personal Factors and Comorbidities  Comorbidity 1;Comorbidity 2    Comorbidities  RCR, HTN.    Examination-Activity Limitations  Locomotion Level;Stand;Stairs;Other    Examination-Participation Restrictions  Other    Stability/Clinical Decision Making  Stable/Uncomplicated    Rehab Potential  Excellent    PT Treatment/Interventions  ADLs/Self Care Home Management;Cryotherapy;Electrical Stimulation;Moist Heat;Gait training;Stair training;Functional mobility training;Therapeutic activities;Therapeutic exercise;Neuromuscular re-education;Manual techniques;Patient/family education;Vasopneumatic Device    PT Next Visit Plan  cont with ROM/ PRE's, Vasopnuematic. eccentric strengthening of left knee    Consulted and Agree with Plan of Care  Patient       Patient will benefit from skilled therapeutic intervention in order to improve the following deficits and impairments:  Decreased activity tolerance, Pain, Decreased strength, Decreased range of motion, Increased edema  Visit Diagnosis: Localized edema  Stiffness of left knee, not elsewhere classified  Muscle weakness (generalized)     Problem List There are no problems to display for this patient.   Standley Brooking, PTA 09/26/2019, 3:45 PM  [facility] [address] [place], [place], [address] Phone: [phone]   Fax:  [phone]  Name: Jenna Bentley MRN: [id number] Date of Birth: 1950/07/29

## 2019-09-29 ENCOUNTER — Ambulatory Visit: Payer: Medicare HMO | Attending: Orthopedic Surgery | Admitting: Physical Therapy

## 2019-09-29 ENCOUNTER — Other Ambulatory Visit: Payer: Self-pay

## 2019-09-29 ENCOUNTER — Encounter: Payer: Self-pay | Admitting: Physical Therapy

## 2019-09-29 DIAGNOSIS — M25562 Pain in left knee: Secondary | ICD-10-CM | POA: Insufficient documentation

## 2019-09-29 DIAGNOSIS — M6281 Muscle weakness (generalized): Secondary | ICD-10-CM | POA: Insufficient documentation

## 2019-09-29 DIAGNOSIS — R600 Localized edema: Secondary | ICD-10-CM

## 2019-09-29 DIAGNOSIS — G8929 Other chronic pain: Secondary | ICD-10-CM | POA: Diagnosis present

## 2019-09-29 DIAGNOSIS — M25662 Stiffness of left knee, not elsewhere classified: Secondary | ICD-10-CM | POA: Insufficient documentation

## 2019-09-29 NOTE — Therapy (Signed)
[facility] [place], [place], [address] Phone: [phone]   Fax:  [phone]  Physical Therapy Treatment  Patient Details  Name: Jenna Bentley MRN: [id number] Date of Birth: [birth year] Referring Provider (PT): Edmonia Lynch MD   Encounter Date: 09/29/2019  PT End of Session - 09/29/19 1443    Visit Number  13    Number of Visits  16    Date for PT Re-Evaluation  10/10/19    Authorization Type  FOTO 8th visit 39% limitation    PT Start Time  1433    PT Stop Time  1519    PT Time Calculation (min)  46 min    Activity Tolerance  Patient tolerated treatment well    Behavior During Therapy  [facility] for tasks assessed/performed       Past Medical History:  Diagnosis Date  . HTN (hypertension)   . S/P rotator cuff repair     History reviewed. No pertinent surgical history.  There were no vitals filed for this visit.  Subjective Assessment - 09/29/19 1443    Subjective  COVID-19 screen performed prior to patient entering clinic. Reports some ache after being outside today and going to the grocery store.    Pertinent History  RCR, HTN.    Limitations  Walking;Standing    How long can you stand comfortably?  5 minutes.    How long can you walk comfortably?  Around house with walker.    Patient Stated Goals  Get back to normal.    Currently in Pain?  Yes    Pain Score  3     Pain Location  Knee    Pain Orientation  Left    Pain Descriptors / Indicators  Aching    Pain Type  Surgical pain    Pain Onset  More than a month ago    Pain Frequency  Intermittent         [facility] PT Assessment - 09/29/19 0001      Assessment   Medical Diagnosis  Left total knee replacement    Referring Provider (PT)  Edmonia Lynch MD    Onset Date/Surgical Date  08/11/19    Next MD Visit  2022      Restrictions   Weight Bearing Restrictions  No                   [facility] Adult PT Treatment/Exercise - 09/29/19 0001      Knee/Hip Exercises: Aerobic   Recumbent Bike  L3, seat 2 x15 min      Knee/Hip Exercises: Machines for Strengthening   Cybex Knee Extension  10# 3x10 reps    Cybex Knee Flexion  40# 3x10 reps    Cybex Leg Press  1.5 plate 3x10 reps      Knee/Hip Exercises: Standing   Step Down  Left;3 sets;10 reps;Hand Hold: 2;Step Height: 4"   step back for quad, heel dot laterally     Knee/Hip Exercises: Supine   Straight Leg Raise with External Rotation  AROM;Left;2 sets;10 reps      Modalities   Modalities  Vasopneumatic      Acupuncturist Location  L knee    Electrical Stimulation Action  IFC    Electrical Stimulation Parameters  80-150 hz x10 min    Electrical Stimulation Goals  Pain      Vasopneumatic   Number Minutes Vasopneumatic   10 minutes    Vasopnuematic Location  Knee    Vasopneumatic Pressure  Low    Vasopneumatic Temperature   34 for edema               PT Short Term Goals - 08/15/19 1411      PT SHORT TERM GOAL #1   Title  STG's=LTG's.        PT Long Term Goals - 09/20/19 1352      PT LONG TERM GOAL #2   Title  Independent with advanced HEP    Time  8    Period  Weeks    Status  Achieved      PT LONG TERM GOAL #3   Title  Full active left knee extension in order to normalize gait.    Time  8    Period  Weeks    Status  Achieved      PT LONG TERM GOAL #4   Title  Active knee flexion to 115 degrees+ so the patient can perform functional tasks and do so with pain not > 2-3/10.    Time  8    Period  Weeks    Status  Achieved      PT LONG TERM GOAL #5   Title  Increase left knee strength to a solid 4+/5 to provide good stability for accomplishment of functional activities.    Status  Achieved      PT LONG TERM GOAL #6   Title  Perform a reciprocating stair gait with one railing with pain not > 2-3/10.    Time  8    Period  Weeks    Status  Partially Met   able to ascend reciprocally; step to step descending            Plan - 09/29/19 1515    Clinical Impression Statement  Patient presented in clinic with reports of more L knee ache. Patient reports being outside today in cooler weather to do some yardwork and also going to the grocery store today. Patient progressed through more eccentric and quad strengthening to improve stair ambulation and confidence. Normal modalities response noted following removal of the modalities.    Personal Factors and Comorbidities  Comorbidity 1;Comorbidity 2    Comorbidities  RCR, HTN.    Examination-Activity Limitations  Locomotion Level;Stand;Stairs;Other    Examination-Participation Restrictions  Other    Stability/Clinical Decision Making  Stable/Uncomplicated    Rehab Potential  Excellent    PT Treatment/Interventions  ADLs/Self Care Home Management;Cryotherapy;Electrical Stimulation;Moist Heat;Gait training;Stair training;Functional mobility training;Therapeutic activities;Therapeutic exercise;Neuromuscular re-education;Manual techniques;Patient/family education;Vasopneumatic Device    PT Next Visit Plan  cont with ROM/ PRE's, Vasopnuematic. eccentric strengthening of left knee    Consulted and Agree with Plan of Care  Patient       Patient will benefit from skilled therapeutic intervention in order to improve the following deficits and impairments:  Decreased activity tolerance, Pain, Decreased strength, Decreased range of motion, Increased edema  Visit Diagnosis: Localized edema  Stiffness of left knee, not elsewhere classified  Muscle weakness (generalized)  Chronic pain of left knee     Problem List There are no problems to display for this patient.   Standley Brooking, PTA 09/29/2019, 3:25 PM  [facility] [address] [place], [place], [address] Phone: [phone]   Fax:  [phone]  Name: Jenna Bentley MRN: [id number] Date of Birth: 1951/06/11

## 2019-10-03 ENCOUNTER — Encounter: Payer: Self-pay | Admitting: Physical Therapy

## 2019-10-03 ENCOUNTER — Ambulatory Visit: Payer: Medicare HMO | Admitting: Physical Therapy

## 2019-10-03 ENCOUNTER — Other Ambulatory Visit: Payer: Self-pay

## 2019-10-03 DIAGNOSIS — R600 Localized edema: Secondary | ICD-10-CM | POA: Diagnosis not present

## 2019-10-03 DIAGNOSIS — G8929 Other chronic pain: Secondary | ICD-10-CM

## 2019-10-03 DIAGNOSIS — M6281 Muscle weakness (generalized): Secondary | ICD-10-CM

## 2019-10-03 DIAGNOSIS — M25662 Stiffness of left knee, not elsewhere classified: Secondary | ICD-10-CM

## 2019-10-03 NOTE — Therapy (Signed)
[facility] [place], [place], [address] Phone: [phone]   Fax:  [phone]  Physical Therapy Treatment  Patient Details  Name: ESTEFANI BATESON MRN: [id number] Date of Birth: Jan 01, 1951 Referring Provider (PT): Edmonia Lynch MD   Encounter Date: 10/03/2019  PT End of Session - 10/03/19 1536    Visit Number  14    Number of Visits  16    Date for PT Re-Evaluation  10/10/19    Authorization Type  FOTO 8th visit 39% limitation    PT Start Time  8003    PT Stop Time  1603    PT Time Calculation (min)  47 min    Activity Tolerance  Patient tolerated treatment well    Behavior During Therapy  [facility] for tasks assessed/performed       Past Medical History:  Diagnosis Date  . HTN (hypertension)   . S/P rotator cuff repair     History reviewed. No pertinent surgical history.  There were no vitals filed for this visit.  Subjective Assessment - 10/03/19 1511    Subjective  COVID-19 screen performed prior to patient entering clinic. No pain today or over the weekend while cooking.    Pertinent History  RCR, HTN.    Limitations  Walking;Standing    How long can you stand comfortably?  5 minutes.    How long can you walk comfortably?  Around house with walker.    Patient Stated Goals  Get back to normal.    Currently in Pain?  No/denies         [facility] PT Assessment - 10/03/19 0001      Assessment   Medical Diagnosis  Left total knee replacement    Referring Provider (PT)  Edmonia Lynch MD    Onset Date/Surgical Date  08/11/19    Next MD Visit  2022      Restrictions   Weight Bearing Restrictions  No                   [facility] Adult PT Treatment/Exercise - 10/03/19 0001      Ambulation/Gait   Stairs  Yes    Stairs Assistance  7: Independent    Stair Management Technique  One rail Right;Alternating pattern;Forwards    Number of Stairs  4   x 2 RT   Height of Stairs  6.5      Knee/Hip Exercises:  Aerobic   Recumbent Bike  L2, seat 1 x15 min      Knee/Hip Exercises: Machines for Strengthening   Cybex Knee Extension  10# 3x10 reps    Cybex Knee Flexion  40# 3x10 reps    Cybex Leg Press  2.5 pl, seat 6 x30 reps      Knee/Hip Exercises: Standing   Terminal Knee Extension  Strengthening;Left;3 sets;10 reps;Theraband    Theraband Level (Terminal Knee Extension)  Level 3 (Green)    Step Down  Left;2 sets;10 reps;Hand Hold: 2;Step Height: 4"   heel dot     Modalities   Modalities  Electrical Stimulation;Vasopneumatic      Electrical Stimulation   Electrical Stimulation Location  L knee    Electrical Stimulation Action  IFC    Electrical Stimulation Parameters  80-150 hz x10 min    Electrical Stimulation Goals  Pain      Vasopneumatic   Number Minutes Vasopneumatic   10 minutes    Vasopnuematic Location   Knee    Vasopneumatic Pressure  Medium  Vasopneumatic Temperature   34 for edema               PT Short Term Goals - 08/15/19 1411      PT SHORT TERM GOAL #1   Title  STG's=LTG's.        PT Long Term Goals - 09/20/19 1352      PT LONG TERM GOAL #2   Title  Independent with advanced HEP    Time  8    Period  Weeks    Status  Achieved      PT LONG TERM GOAL #3   Title  Full active left knee extension in order to normalize gait.    Time  8    Period  Weeks    Status  Achieved      PT LONG TERM GOAL #4   Title  Active knee flexion to 115 degrees+ so the patient can perform functional tasks and do so with pain not > 2-3/10.    Time  8    Period  Weeks    Status  Achieved      PT LONG TERM GOAL #5   Title  Increase left knee strength to a solid 4+/5 to provide good stability for accomplishment of functional activities.    Status  Achieved      PT LONG TERM GOAL #6   Title  Perform a reciprocating stair gait with one railing with pain not > 2-3/10.    Time  8    Period  Weeks    Status  Partially Met   able to ascend reciprocally; step to step  descending           Plan - 10/03/19 1611    Clinical Impression Statement  Patient presented in clinic with no complaints of L knee pain. Patient progressed for more reps/resistance with strength training today. No complaints of knee pain during therex. Patient able to ascend and descend stairs reciprically with no hesitancy. Patient does report more fear if ambulating a taller staircase. Normal modalities response noted following removal of the modalities. Patient able to ambulate WNL and very naturally at this time without AD.    Personal Factors and Comorbidities  Comorbidity 1;Comorbidity 2    Comorbidities  RCR, HTN.    Examination-Activity Limitations  Locomotion Level;Stand;Stairs;Other    Examination-Participation Restrictions  Other    Stability/Clinical Decision Making  Stable/Uncomplicated    Rehab Potential  Excellent    PT Treatment/Interventions  ADLs/Self Care Home Management;Cryotherapy;Electrical Stimulation;Moist Heat;Gait training;Stair training;Functional mobility training;Therapeutic activities;Therapeutic exercise;Neuromuscular re-education;Manual techniques;Patient/family education;Vasopneumatic Device    PT Next Visit Plan  cont with ROM/ PRE's, Vasopnuematic. eccentric strengthening of left knee    Consulted and Agree with Plan of Care  Patient       Patient will benefit from skilled therapeutic intervention in order to improve the following deficits and impairments:  Decreased activity tolerance, Pain, Decreased strength, Decreased range of motion, Increased edema  Visit Diagnosis: Localized edema  Stiffness of left knee, not elsewhere classified  Muscle weakness (generalized)  Chronic pain of left knee     Problem List There are no problems to display for this patient.   Standley Brooking, PTA 10/03/2019, 4:15 PM  [facility] [place], [place], [address] Phone: [phone]   Fax:   [phone]  Name: EDITA WEYENBERG MRN: [id number] Date of Birth: 06-28-1951

## 2019-10-06 ENCOUNTER — Ambulatory Visit: Payer: Medicare HMO | Admitting: Physical Therapy

## 2019-10-10 ENCOUNTER — Ambulatory Visit: Payer: Medicare HMO | Admitting: Physical Therapy

## 2019-10-10 ENCOUNTER — Encounter: Payer: Self-pay | Admitting: Physical Therapy

## 2019-10-10 ENCOUNTER — Other Ambulatory Visit: Payer: Self-pay

## 2019-10-10 DIAGNOSIS — R600 Localized edema: Secondary | ICD-10-CM | POA: Diagnosis not present

## 2019-10-10 DIAGNOSIS — M25662 Stiffness of left knee, not elsewhere classified: Secondary | ICD-10-CM

## 2019-10-10 DIAGNOSIS — G8929 Other chronic pain: Secondary | ICD-10-CM

## 2019-10-10 DIAGNOSIS — M6281 Muscle weakness (generalized): Secondary | ICD-10-CM

## 2019-10-10 NOTE — Therapy (Signed)
[facility] [place], [place], [address] Phone: [phone]   Fax:  [phone]  Physical Therapy Treatment  Patient Details  Name: Jenna Bentley MRN: [id number] Date of Birth: 06/28/1951 Referring Provider (PT): Edmonia Lynch MD   Encounter Date: 10/10/2019  PT End of Session - 10/10/19 1434    Visit Number  15    Number of Visits  16    Date for PT Re-Evaluation  10/10/19    Authorization Type  FOTO 8th visit 39% limitation    PT Start Time  1431    PT Stop Time  1514    PT Time Calculation (min)  43 min    Activity Tolerance  Patient tolerated treatment well    Behavior During Therapy  [facility] for tasks assessed/performed       Past Medical History:  Diagnosis Date  . HTN (hypertension)   . S/P rotator cuff repair     History reviewed. No pertinent surgical history.  There were no vitals filed for this visit.  Subjective Assessment - 10/10/19 1432    Subjective  COVID-19 screen performed prior to patient entering clinic. Reports she has been very active with yardwork but not using her stationary bike. Reports a little sore and stiff with yardwork.    Pertinent History  RCR, HTN.    Limitations  Walking;Standing    How long can you stand comfortably?  5 minutes.    How long can you walk comfortably?  Around house with walker.    Patient Stated Goals  Get back to normal.    Currently in Pain?  No/denies         [facility] PT Assessment - 10/10/19 0001      Assessment   Medical Diagnosis  Left total knee replacement    Referring Provider (PT)  Edmonia Lynch MD    Onset Date/Surgical Date  08/11/19    Next MD Visit  2022      Restrictions   Weight Bearing Restrictions  No                   [facility] Adult PT Treatment/Exercise - 10/10/19 0001      Ambulation/Gait   Stairs  Yes    Stairs Assistance  7: Independent    Stair Management Technique  One rail Right;Alternating pattern;Forwards    Number of Stairs  4   x1 RT   Height of Stairs  6.5      Knee/Hip Exercises: Aerobic   Recumbent Bike  L4, seat 2 x15 min      Knee/Hip Exercises: Machines for Strengthening   Cybex Knee Extension  20# 3x10 reps    Cybex Knee Flexion  40# 3x10 reps    Cybex Leg Press  2.5 pl, seat 6 x30 reps   eccentric lowering   Total Gym Leg Press  1      Knee/Hip Exercises: Seated   Sit to Sand  15 reps;without UE support   2" step under RLE, 10# kettlebell      Knee/Hip Exercises: Supine   Single Leg Bridge  Strengthening;Left;2 sets;10 reps      Modalities   Modalities  Vasopneumatic      Vasopneumatic   Number Minutes Vasopneumatic   10 minutes    Vasopnuematic Location   Knee    Vasopneumatic Pressure  Medium    Vasopneumatic Temperature   34 for edema  PT Short Term Goals - 08/15/19 1411      PT SHORT TERM GOAL #1   Title  STG's=LTG's.        PT Long Term Goals - 10/10/19 1523      PT LONG TERM GOAL #2   Title  Independent with advanced HEP    Time  8    Period  Weeks    Status  Achieved      PT LONG TERM GOAL #3   Title  Full active left knee extension in order to normalize gait.    Time  8    Period  Weeks    Status  Achieved      PT LONG TERM GOAL #4   Title  Active knee flexion to 115 degrees+ so the patient can perform functional tasks and do so with pain not > 2-3/10.    Time  8    Period  Weeks    Status  Achieved      PT LONG TERM GOAL #5   Title  Increase left knee strength to a solid 4+/5 to provide good stability for accomplishment of functional activities.    Status  Achieved      PT LONG TERM GOAL #6   Title  Perform a reciprocating stair gait with one railing with pain not > 2-3/10.    Time  8    Period  Weeks    Status  Partially Met   reports pull at distal L ITB descending 10/10/2019           Plan - 10/10/19 1524    Clinical Impression Statement  Patient presented in clinic with reports of stiffness and  mininal soreness of L knee. Patient very independent at this time with housework, yardwork. Patient had no complaints of discomfort and required minimal multimodal cueing to ensure proper technique with new therex. Only minimal pull at distal L ITB with descending stairs reported. Normal vasopnuematic response noted following removal of the modality.    Personal Factors and Comorbidities  Comorbidity 1;Comorbidity 2    Comorbidities  RCR, HTN.    Examination-Activity Limitations  Locomotion Level;Stand;Stairs;Other    Examination-Participation Restrictions  Other    Stability/Clinical Decision Making  Stable/Uncomplicated    Rehab Potential  Excellent    PT Treatment/Interventions  ADLs/Self Care Home Management;Cryotherapy;Electrical Stimulation;Moist Heat;Gait training;Stair training;Functional mobility training;Therapeutic activities;Therapeutic exercise;Neuromuscular re-education;Manual techniques;Patient/family education;Vasopneumatic Device    PT Next Visit Plan  cont with ROM/ PRE's, Vasopnuematic. eccentric strengthening of left knee    Consulted and Agree with Plan of Care  Patient       Patient will benefit from skilled therapeutic intervention in order to improve the following deficits and impairments:  Decreased activity tolerance, Pain, Decreased strength, Decreased range of motion, Increased edema  Visit Diagnosis: Localized edema  Stiffness of left knee, not elsewhere classified  Muscle weakness (generalized)  Chronic pain of left knee     Problem List There are no problems to display for this patient.   Standley Brooking, PTA 10/10/2019, 3:36 PM  [facility] [address] [place], [place], [address] Phone: [phone]   Fax:  [phone]  Name: BRYANNA YIM MRN: [id number] Date of Birth: 10-01-1950

## 2019-10-17 ENCOUNTER — Ambulatory Visit: Payer: Medicare HMO | Admitting: Physical Therapy

## 2019-10-17 ENCOUNTER — Other Ambulatory Visit: Payer: Self-pay

## 2019-10-17 DIAGNOSIS — M25662 Stiffness of left knee, not elsewhere classified: Secondary | ICD-10-CM

## 2019-10-17 DIAGNOSIS — M25562 Pain in left knee: Secondary | ICD-10-CM

## 2019-10-17 DIAGNOSIS — G8929 Other chronic pain: Secondary | ICD-10-CM

## 2019-10-17 DIAGNOSIS — M6281 Muscle weakness (generalized): Secondary | ICD-10-CM

## 2019-10-17 DIAGNOSIS — R600 Localized edema: Secondary | ICD-10-CM | POA: Diagnosis not present

## 2019-10-17 NOTE — Therapy (Signed)
[facility] [place], [place], [address] Phone: [phone]   Fax:  [phone]  Physical Therapy Treatment  Patient Details  Name: Jenna Bentley MRN: [id number] Date of Birth: December 08, 1950 Referring Provider (PT): Edmonia Lynch MD   Encounter Date: 10/17/2019  PT End of Session - 10/17/19 1356    Visit Number  16    Number of Visits  16    Date for PT Re-Evaluation  10/10/19    Authorization Type  FOTO 8th visit 39% limitation    PT Start Time  0150    PT Stop Time  0235    PT Time Calculation (min)  45 min    Activity Tolerance  Patient tolerated treatment well    Behavior During Therapy  [facility] for tasks assessed/performed       Past Medical History:  Diagnosis Date  . HTN (hypertension)   . S/P rotator cuff repair     No past surgical history on file.  There were no vitals filed for this visit.  Subjective Assessment - 10/17/19 1355    Subjective  COVID-19 screen performed prior to patient entering clinic.  lat day.    Pertinent History  RCR, HTN.    Limitations  Walking;Standing    How long can you walk comfortably?  Around house with walker.    Patient Stated Goals  Get back to normal.    Currently in Pain?  Yes    Pain Location  Knee    Pain Orientation  Left    Pain Descriptors / Indicators  --   "Stiff."   Pain Type  Surgical pain                       [facility] Adult PT Treatment/Exercise - 10/17/19 0001      Exercises   Exercises  Knee/Hip      Knee/Hip Exercises: Aerobic   Recumbent Bike  15 minutes.      Knee/Hip Exercises: Machines for Strengthening   Cybex Knee Extension  10# x 3 minutes.    Cybex Knee Flexion  30# x 3 minutes.      Modalities   Modalities  Vasopneumatic      Vasopneumatic   Number Minutes Vasopneumatic   15 minutes    Vasopnuematic Location   --   Left knee.   Vasopneumatic Pressure  Low      Manual Therapy   Passive ROM  In supine:  Flexion  stretching x 3 minutes.               PT Short Term Goals - 08/15/19 1411      PT SHORT TERM GOAL #1   Title  STG's=LTG's.        PT Long Term Goals - 10/17/19 1419      PT LONG TERM GOAL #1   Title  demonstrate and/or verbalize techniques to reduce the risk of re-injury to include info on: anti-inflammatory (RICE Method)    Time  8    Period  Weeks    Status  Achieved      PT LONG TERM GOAL #2   Title  Independent with advanced HEP    Time  8    Period  Weeks    Status  Achieved      PT LONG TERM GOAL #3   Title  Full active left knee extension in order to normalize gait.    Time  8  Period  Weeks    Status  Achieved      PT LONG TERM GOAL #4   Title  Active knee flexion to 115 degrees+ so the patient can perform functional tasks and do so with pain not > 2-3/10.    Time  8    Period  Weeks      PT LONG TERM GOAL #5   Title  Increase left knee strength to a solid 4+/5 to provide good stability for accomplishment of functional activities.    Time  8    Period  Weeks    Status  Achieved      PT LONG TERM GOAL #6   Title  Perform a reciprocating stair gait with one railing with pain not > 2-3/10.    Time  8    Period  Weeks    Status  Achieved              Patient will benefit from skilled therapeutic intervention in order to improve the following deficits and impairments:     Visit Diagnosis: Localized edema  Stiffness of left knee, not elsewhere classified  Muscle weakness (generalized)  Chronic pain of left knee     Problem List There are no problems to display for this patient.   Darriel Utter, [place] MPT 10/17/2019, 2:38 PM  [facility] [address] [place], [place], [address] Phone: [phone]   Fax:  [phone]  Name: ADRI SCHLOSS MRN: [id number] Date of Birth: 07-Aug-1950  PHYSICAL THERAPY DISCHARGE SUMMARY  Visits from Start of Care: 16.  Current functional level related  to goals / functional outcomes: See above.  Remaining deficits: All goals met.   Education / Equipment: HEP. Plan: Patient agrees to discharge.  Patient goals were met. Patient is being discharged due to meeting the stated rehab goals.  ?????         Mali Dacian Orrico MPT

## 2019-12-28 ENCOUNTER — Other Ambulatory Visit: Payer: Self-pay | Admitting: Family Medicine

## 2019-12-28 DIAGNOSIS — Z1231 Encounter for screening mammogram for malignant neoplasm of breast: Secondary | ICD-10-CM

## 2020-01-30 ENCOUNTER — Other Ambulatory Visit: Payer: Self-pay

## 2020-01-30 ENCOUNTER — Ambulatory Visit
Admission: RE | Admit: 2020-01-30 | Discharge: 2020-01-30 | Disposition: A | Discharge status: Home / Self Care | Payer: Medicare HMO | Source: Ambulatory Visit | Attending: Family Medicine | Admitting: Family Medicine

## 2020-01-30 DIAGNOSIS — Z1231 Encounter for screening mammogram for malignant neoplasm of breast: Secondary | ICD-10-CM

## 2020-05-14 ENCOUNTER — Ambulatory Visit: Payer: Medicare HMO | Attending: Orthopedic Surgery | Admitting: Physical Therapy

## 2020-05-14 ENCOUNTER — Other Ambulatory Visit: Payer: Self-pay

## 2020-05-14 DIAGNOSIS — G8929 Other chronic pain: Secondary | ICD-10-CM | POA: Diagnosis present

## 2020-05-14 DIAGNOSIS — M25561 Pain in right knee: Secondary | ICD-10-CM | POA: Diagnosis not present

## 2020-05-14 DIAGNOSIS — M25661 Stiffness of right knee, not elsewhere classified: Secondary | ICD-10-CM

## 2020-05-14 DIAGNOSIS — R600 Localized edema: Secondary | ICD-10-CM | POA: Diagnosis present

## 2020-05-14 NOTE — Therapy (Signed)
[facility] [place], [place], [address] Phone: [phone]   Fax:  [phone]  Physical Therapy Evaluation  Patient Details  Name: Jenna Bentley MRN: [id number] Date of Birth: 04-Sep-1950 Referring Provider (PT): Edmonia Lynch MD   Encounter Date: 05/14/2020   PT End of Session - 05/14/20 1504    Visit Number 1    Number of Visits 12    Date for PT Re-Evaluation 08/13/20    Authorization Type FOTO AT LEAST EVERY 5TH VISIT.  PROGRESS NOTE AT 10TH VISIT.  KX MODIFIER AFTER 15 VISITS.    PT Start Time 0145    PT Stop Time 0235    PT Time Calculation (min) 50 min    Activity Tolerance Patient tolerated treatment well    Behavior During Therapy WFL for tasks assessed/performed           Past Medical History:  Diagnosis Date  . HTN (hypertension)   . S/P rotator cuff repair     No past surgical history on file.  There were no vitals filed for this visit.    Subjective Assessment - 05/14/20 1420    Subjective COVID-19 screen performed prior to patient entering clinic. The patient underwent a right total knee replacement on 05/10/20.  She is pleased with the outcome thus far.  She is using a walker for safe ambulation.  She is doing her HEP.  Her pain is rated at a 4/10 today increasing wiht sit to stand transfers.  She is compliant to using her TED hose as well.    Pertinent History HTN, RTC repair, left TKA.    How long can you walk comfortably? Around house with walker.    Patient Stated Goals Get back to normal life.    Currently in Pain? Yes    Pain Score 4     Pain Location Knee    Pain Orientation Right    Pain Descriptors / Indicators Aching;Dull    Pain Type Surgical pain    Pain Onset More than a month ago    Pain Frequency Constant    Aggravating Factors  See above.    Pain Relieving Factors See above.              [facility] PT Assessment - 05/14/20 0001      Assessment   Medical Diagnosis Right  total knee replacement.    Referring Provider (PT) Edmonia Lynch MD    Onset Date/Surgical Date --   07/11/19 (surgery date).     Precautions   Precaution Comments No ultrasound.      Restrictions   Weight Bearing Restrictions No      Balance Screen   Has the patient fallen in the past 6 months No    Has the patient had a decrease in activity level because of a fear of falling?  No    Is the patient reluctant to leave their home because of a fear of falling?  No      Home Environment   Living Environment Private residence      Prior Function   Level of Independence Independent      Observation/Other Assessments   Observations Aquacel intact and TED hose donned.    Focus on Therapeutic Outcomes (FOTO)  68% limitation.      Observation/Other Assessments-Edema    Edema --   RT 3 cms > LT.     ROM / Strength   AROM / PROM / Strength AROM;Strength  AROM   Overall AROM Comments Rt knee -5 to 95 degrees.      Strength   Overall Strength Comments Patient able to perform a right antigravity SLR easily and right quad strength is graded at a 4 to 4+/5.      Palpation   Palpation comment Mild right anterior knee pain.      Ambulation/Gait   Gait Comments Safe normal gait pattern with a standard walker.                      Objective measurements completed on examination: See above findings.       [facility] Adult PT Treatment/Exercise - 05/14/20 0001      Exercises   Exercises Knee/Hip      Knee/Hip Exercises: Aerobic   Nustep Level 3 x 10 minutes moving seat forwadr x one to increase knee flexion.      Modalities   Modalities Vasopneumatic      Vasopneumatic   Number Minutes Vasopneumatic  15 minutes    Vasopnuematic Location  --   RT knee.   Vasopneumatic Pressure Low                       PT Long Term Goals - 05/14/20 1523      PT LONG TERM GOAL #1   Title Independent with a HEP.    Time 6    Period Weeks    Status New      PT  LONG TERM GOAL #3   Title Full active right  knee extension in order to normalize gait.    Time 6    Period Weeks    Status New      PT LONG TERM GOAL #4   Title Active right knee flexion to 120 degrees+ so the patient can perform functional tasks and do so with pain not > 2-3/10.    Time 6    Period Weeks    Status New      PT LONG TERM GOAL #5   Title Increase right knee strength to a solid 5/5 to provide good stability for accomplishment of functional activities.    Time 6    Period Weeks    Status New                  Plan - 05/14/20 1514    Clinical Impression Statement The patient  presents to OPPT s/p right total knee replacement performed on 05/10/20.  She is doing very well currently.  As expected she is lacking some right knee flexion and extension.  She has min+ edema.  She demonstrates very good right hip/knee antigravity movement.  She is walking normally and safely wiht a standard walker.  She is compliant to using her TED hose and she is doing a HEP.  Patient will benefit from skilled physical therapy intervention to address deficits and pain.    Personal Factors and Comorbidities Comorbidity 1;Comorbidity 2    Comorbidities HTN, RTC repair, left TKA.    Examination-Activity Limitations Other;Locomotion Level    Examination-Participation Restrictions Other    Stability/Clinical Decision Making Stable/Uncomplicated    Clinical Decision Making Low    Rehab Potential Excellent    PT Frequency 2x / week    PT Duration 6 weeks    PT Treatment/Interventions ADLs/Self Care Home Management;Cryotherapy;Electrical Stimulation;Moist Heat;Gait training;Stair training;Functional mobility training;Therapeutic activities;Therapeutic exercise;Neuromuscular re-education;Manual techniques;Patient/family education;Passive range of motion;Vasopneumatic Device    PT Next Visit  Plan Progress into TKA protocol.  She should be able to progress to bike soon.  Vasopnuematic.     Consulted and Agree with Plan of Care Patient           Patient will benefit from skilled therapeutic intervention in order to improve the following deficits and impairments:  Pain, Decreased strength, Increased edema, Decreased activity tolerance, Decreased range of motion  Visit Diagnosis: Chronic pain of right knee - Plan: PT plan of care cert/re-cert  Stiffness of right knee, not elsewhere classified - Plan: PT plan of care cert/re-cert  Localized edema - Plan: PT plan of care cert/re-cert     Problem List There are no problems to display for this patient.   Sandie Swayze, [place] MPT 05/14/2020, 3:27 PM  [facility] [address] [place], [place], [address] Phone: [phone]   Fax:  [phone]  Name: Jenna Bentley MRN: [id number] Date of Birth: [birth year]

## 2020-05-16 ENCOUNTER — Ambulatory Visit: Payer: Medicare HMO | Admitting: Physical Therapy

## 2020-05-16 ENCOUNTER — Other Ambulatory Visit: Payer: Self-pay

## 2020-05-16 DIAGNOSIS — M25661 Stiffness of right knee, not elsewhere classified: Secondary | ICD-10-CM

## 2020-05-16 DIAGNOSIS — R600 Localized edema: Secondary | ICD-10-CM

## 2020-05-16 DIAGNOSIS — G8929 Other chronic pain: Secondary | ICD-10-CM

## 2020-05-16 DIAGNOSIS — M25561 Pain in right knee: Secondary | ICD-10-CM | POA: Diagnosis not present

## 2020-05-16 NOTE — Therapy (Signed)
[facility] [place], [place], [address] Phone: [phone]   Fax:  [phone]  Physical Therapy Treatment  Patient Details  Name: Jenna Bentley MRN: [id number] Date of Birth: April 07, 1951 Referring Provider (PT): Edmonia Lynch MD   Encounter Date: 05/16/2020   PT End of Session - 05/16/20 1445    Visit Number 2    Number of Visits 12    Date for PT Re-Evaluation 08/13/20    Authorization Type FOTO AT LEAST EVERY 5TH VISIT.  PROGRESS NOTE AT 10TH VISIT.  KX MODIFIER AFTER 15 VISITS.    PT Start Time 0145    PT Stop Time 0237    PT Time Calculation (min) 52 min    Activity Tolerance Patient tolerated treatment well    Behavior During Therapy WFL for tasks assessed/performed           Past Medical History:  Diagnosis Date  . HTN (hypertension)   . S/P rotator cuff repair     No past surgical history on file.  There were no vitals filed for this visit.   Subjective Assessment - 05/16/20 1429    Subjective COVID-19 screen performed prior to patient entering clinic.  Patient states she had a fever yesterday (<100 degrees).  This morning it was normal again.  Patient (per MD office) to call if temperature were to rise to 101 degrees.    Pertinent History HTN, RTC repair, left TKA.    How long can you walk comfortably? Around house with walker.    Currently in Pain? Yes    Pain Score 4     Pain Location Knee    Pain Orientation Right    Pain Descriptors / Indicators Aching;Dull    Pain Onset More than a month ago              [facility] PT Assessment - 05/16/20 0001      AROM   AROM Assessment Site Knee    Right/Left Knee Right    Right Knee Flexion 105                         [facility] Adult PT Treatment/Exercise - 05/16/20 0001      Exercises   Exercises Knee/Hip      Knee/Hip Exercises: Aerobic   Nustep Level 3 x 15 minutes moving forward x 2 to increase flexion.      Modalities    Modalities Vasopneumatic      Vasopneumatic   Number Minutes Vasopneumatic  20 minutes    Vasopnuematic Location  --   Right knee.   Vasopneumatic Pressure Medium      Manual Therapy   Manual Therapy Passive ROM    Manual therapy comments In supine:  PROM to patient's right knee into flexion and extension with low load long duration stretching into flexion and extension x 10 minutes.                       PT Long Term Goals - 05/14/20 1523      PT LONG TERM GOAL #1   Title Independent with a HEP.    Time 6    Period Weeks    Status New      PT LONG TERM GOAL #3   Title Full active right  knee extension in order to normalize gait.    Time 6    Period Weeks    Status New  PT LONG TERM GOAL #4   Title Active right knee flexion to 120 degrees+ so the patient can perform functional tasks and do so with pain not > 2-3/10.    Time 6    Period Weeks    Status New      PT LONG TERM GOAL #5   Title Increase right knee strength to a solid 5/5 to provide good stability for accomplishment of functional activities.    Time 6    Period Weeks    Status New                 Plan - 05/16/20 1442    Clinical Impression Statement The patient did very well today.  She had a fever last night (<100 degrees) which was normal today.  Her right knee flexion improved to 105 degrees today and her edema was .5 cms less than initially assessed.    Personal Factors and Comorbidities Comorbidity 1;Comorbidity 2    Comorbidities HTN, RTC repair, left TKA.    Examination-Activity Limitations Other;Locomotion Level    Examination-Participation Restrictions Other    Stability/Clinical Decision Making Stable/Uncomplicated    Rehab Potential Excellent    PT Frequency 2x / week    PT Duration 6 weeks    PT Treatment/Interventions ADLs/Self Care Home Management;Cryotherapy;Electrical Stimulation;Moist Heat;Gait training;Stair training;Functional mobility training;Therapeutic  activities;Therapeutic exercise;Neuromuscular re-education;Manual techniques;Patient/family education;Passive range of motion;Vasopneumatic Device    PT Next Visit Plan Progress into TKA protocol.  She should be able to progress to bike soon.  Vasopnuematic.    Consulted and Agree with Plan of Care Patient           Patient will benefit from skilled therapeutic intervention in order to improve the following deficits and impairments:  Pain, Decreased strength, Increased edema, Decreased activity tolerance, Decreased range of motion  Visit Diagnosis: Chronic pain of right knee  Stiffness of right knee, not elsewhere classified  Localized edema     Problem List There are no problems to display for this patient.   Jordie Skalsky, [place] MPT 05/16/2020, 2:45 PM  [facility] [address] [place], [place], [address] Phone: [phone]   Fax:  [phone]  Name: Jenna Bentley MRN: [id number] Date of Birth: 1950/08/06

## 2020-05-21 ENCOUNTER — Other Ambulatory Visit: Payer: Self-pay

## 2020-05-21 ENCOUNTER — Ambulatory Visit: Payer: Medicare HMO | Admitting: Physical Therapy

## 2020-05-21 DIAGNOSIS — M25561 Pain in right knee: Secondary | ICD-10-CM | POA: Diagnosis not present

## 2020-05-21 DIAGNOSIS — M25661 Stiffness of right knee, not elsewhere classified: Secondary | ICD-10-CM

## 2020-05-21 DIAGNOSIS — R600 Localized edema: Secondary | ICD-10-CM

## 2020-05-21 DIAGNOSIS — G8929 Other chronic pain: Secondary | ICD-10-CM

## 2020-05-21 NOTE — Therapy (Signed)
[facility] [place], [place], [address] Phone: [phone]   Fax:  [phone]  Physical Therapy Treatment  Patient Details  Name: CALENE PARADISO MRN: [id number] Date of Birth: December 29, 1950 Referring Provider (PT): Edmonia Lynch MD   Encounter Date: 05/21/2020   PT End of Session - 05/21/20 1428    Visit Number 3    Number of Visits 12    Date for PT Re-Evaluation 08/13/20    Authorization Type FOTO AT LEAST EVERY 5TH VISIT.  PROGRESS NOTE AT 10TH VISIT.  KX MODIFIER AFTER 15 VISITS.    PT Start Time 0138    PT Stop Time 0226    PT Time Calculation (min) 48 min    Activity Tolerance Patient tolerated treatment well    Behavior During Therapy [facility] for tasks assessed/performed           Past Medical History:  Diagnosis Date  . HTN (hypertension)   . S/P rotator cuff repair     No past surgical history on file.  There were no vitals filed for this visit.   Subjective Assessment - 05/21/20 1425    Subjective COVID-19 screen performed prior to patient entering clinic.  Doing great.  No fever.    Pertinent History HTN, RTC repair, left TKA.    How long can you walk comfortably? Around house with walker.    Currently in Pain? Yes    Pain Score 3     Pain Location Knee    Pain Orientation Right    Pain Descriptors / Indicators Aching;Dull    Pain Type Surgical pain                             [facility] Adult PT Treatment/Exercise - 05/21/20 0001      Exercises   Exercises Knee/Hip      Knee/Hip Exercises: Aerobic   Recumbent Bike 8 minutes moving seat forward eventually to seat 5    Nustep Level x 15 minutes moving seat forward x 3 to ibcrease knee flexion.      Modalities   Modalities Vasopneumatic      Vasopneumatic   Number Minutes Vasopneumatic  20 minutes    Vasopnuematic Location  --   Left knee.   Vasopneumatic Pressure Medium                       PT Long Term  Goals - 05/14/20 1523      PT LONG TERM GOAL #1   Title Independent with a HEP.    Time 6    Period Weeks    Status New      PT LONG TERM GOAL #3   Title Full active right  knee extension in order to normalize gait.    Time 6    Period Weeks    Status New      PT LONG TERM GOAL #4   Title Active right knee flexion to 120 degrees+ so the patient can perform functional tasks and do so with pain not > 2-3/10.    Time 6    Period Weeks    Status New      PT LONG TERM GOAL #5   Title Increase right knee strength to a solid 5/5 to provide good stability for accomplishment of functional activities.    Time 6    Period Weeks    Status New  Plan - 05/21/20 1429    Clinical Impression Statement The patient is making excellent progress and has already progressed to the bike.    Personal Factors and Comorbidities Comorbidity 1;Comorbidity 2    Comorbidities HTN, RTC repair, left TKA.    Examination-Activity Limitations Other;Locomotion Level    Examination-Participation Restrictions Other    Stability/Clinical Decision Making Stable/Uncomplicated    Rehab Potential Excellent    PT Frequency 2x / week    PT Duration 6 weeks    PT Treatment/Interventions ADLs/Self Care Home Management;Cryotherapy;Electrical Stimulation;Moist Heat;Gait training;Stair training;Functional mobility training;Therapeutic activities;Therapeutic exercise;Neuromuscular re-education;Manual techniques;Patient/family education;Passive range of motion;Vasopneumatic Device    PT Next Visit Plan Progress into TKA protocol.  She should be able to progress to bike soon.  Vasopnuematic.    Consulted and Agree with Plan of Care Patient           Patient will benefit from skilled therapeutic intervention in order to improve the following deficits and impairments:  Pain, Decreased strength, Increased edema, Decreased activity tolerance, Decreased range of motion  Visit Diagnosis: Chronic pain of  right knee  Stiffness of right knee, not elsewhere classified  Localized edema     Problem List There are no problems to display for this patient.   Alf Doyle, [place] MPT 05/21/2020, 2:30 PM  [facility] [address] [place], [place], [address] Phone: [phone]   Fax:  [phone]  Name: ZARRIAH STARKEL MRN: [id number] Date of Birth: 1951-03-20

## 2020-05-23 ENCOUNTER — Other Ambulatory Visit: Payer: Self-pay

## 2020-05-23 ENCOUNTER — Ambulatory Visit: Payer: Medicare HMO | Admitting: Physical Therapy

## 2020-05-23 DIAGNOSIS — R600 Localized edema: Secondary | ICD-10-CM

## 2020-05-23 DIAGNOSIS — M25661 Stiffness of right knee, not elsewhere classified: Secondary | ICD-10-CM

## 2020-05-23 DIAGNOSIS — M25561 Pain in right knee: Secondary | ICD-10-CM | POA: Diagnosis not present

## 2020-05-23 DIAGNOSIS — G8929 Other chronic pain: Secondary | ICD-10-CM

## 2020-05-23 NOTE — Therapy (Signed)
[facility] [place], [place], [address] Phone: [phone]   Fax:  [phone]  Physical Therapy Treatment  Patient Details  Name: Jenna Bentley MRN: [id number] Date of Birth: 30-Aug-1950 Referring Provider (PT): Edmonia Lynch MD   Encounter Date: 05/23/2020   PT End of Session - 05/23/20 1418    Visit Number 4    Number of Visits 12    Date for PT Re-Evaluation 08/13/20    Authorization Type FOTO AT LEAST EVERY 5TH VISIT.  PROGRESS NOTE AT 10TH VISIT.  KX MODIFIER AFTER 15 VISITS.    PT Start Time 0117    PT Stop Time 0215    PT Time Calculation (min) 58 min    Activity Tolerance Patient tolerated treatment well    Behavior During Therapy [facility] for tasks assessed/performed           Past Medical History:  Diagnosis Date  . HTN (hypertension)   . S/P rotator cuff repair     No past surgical history on file.  There were no vitals filed for this visit.   Subjective Assessment - 05/23/20 1329    Subjective COVID-19 screen performed prior to patient entering clinic.  Got Aquacel off today.  It hurt.    Pertinent History HTN, RTC repair, left TKA.    How long can you walk comfortably? Around house with walker.    Patient Stated Goals Get back to normal life.    Currently in Pain? Yes    Pain Score 5     Pain Location Knee    Pain Orientation Right    Pain Descriptors / Indicators Aching;Dull    Pain Type Surgical pain    Pain Onset More than a month ago                             [facility] Adult PT Treatment/Exercise - 05/23/20 0001      Exercises   Exercises Knee/Hip      Knee/Hip Exercises: Aerobic   Recumbent Bike Level 2 x 15 minutes.      Knee/Hip Exercises: Supine   Short Arc Quad Sets Limitations 15 minutes with 10 sec extension holds and 10 sec rest facilitated with VMS to patient's right quadriceps for neuro- re-education      Modalities   Modalities Electrical  Stimulation;Vasopneumatic      Electrical Stimulation   Electrical Stimulation Location Right knee.    Electrical Stimulation Action Pre-mod.    Electrical Stimulation Parameters 80-150 Hz x 15 minutes.    Electrical Stimulation Goals Edema;Pain      Vasopneumatic   Number Minutes Vasopneumatic  15 minutes    Vasopnuematic Location  --   Right knee.   Vasopneumatic Pressure Medium                       PT Long Term Goals - 05/14/20 1523      PT LONG TERM GOAL #1   Title Independent with a HEP.    Time 6    Period Weeks    Status New      PT LONG TERM GOAL #3   Title Full active right  knee extension in order to normalize gait.    Time 6    Period Weeks    Status New      PT LONG TERM GOAL #4   Title Active right knee flexion to 120 degrees+  so the patient can perform functional tasks and do so with pain not > 2-3/10.    Time 6    Period Weeks    Status New      PT LONG TERM GOAL #5   Title Increase right knee strength to a solid 5/5 to provide good stability for accomplishment of functional activities.    Time 6    Period Weeks    Status New                 Plan - 05/23/20 1401    Clinical Impression Statement Patient is doing very well.  She did great with VMS to her right quadriceps for neuro re-education.    Personal Factors and Comorbidities Comorbidity 1;Comorbidity 2    Comorbidities HTN, RTC repair, left TKA.    Examination-Activity Limitations Other;Locomotion Level    Examination-Participation Restrictions Other    Stability/Clinical Decision Making Stable/Uncomplicated    Rehab Potential Excellent    PT Frequency 2x / week    PT Duration 6 weeks    PT Treatment/Interventions ADLs/Self Care Home Management;Cryotherapy;Electrical Stimulation;Moist Heat;Gait training;Stair training;Functional mobility training;Therapeutic activities;Therapeutic exercise;Neuromuscular re-education;Manual techniques;Patient/family education;Passive range  of motion;Vasopneumatic Device    PT Next Visit Plan Progress into TKA protocol.  She should be able to progress to bike soon.  Vasopnuematic.    Consulted and Agree with Plan of Care Patient           Patient will benefit from skilled therapeutic intervention in order to improve the following deficits and impairments:  Pain, Decreased strength, Increased edema, Decreased activity tolerance, Decreased range of motion  Visit Diagnosis: Chronic pain of right knee  Stiffness of right knee, not elsewhere classified  Localized edema     Problem List There are no problems to display for this patient.   Vernee Baines, [place]  MPT 05/23/2020, 2:20 PM  [facility] [address] [place], [place], [address] Phone: [phone]   Fax:  [phone]  Name: Jenna Bentley MRN: [id number] Date of Birth: [birth year]

## 2020-05-28 ENCOUNTER — Other Ambulatory Visit: Payer: Self-pay

## 2020-05-28 ENCOUNTER — Ambulatory Visit: Payer: Medicare HMO | Admitting: Physical Therapy

## 2020-05-28 DIAGNOSIS — R600 Localized edema: Secondary | ICD-10-CM

## 2020-05-28 DIAGNOSIS — G8929 Other chronic pain: Secondary | ICD-10-CM

## 2020-05-28 DIAGNOSIS — M25561 Pain in right knee: Secondary | ICD-10-CM | POA: Diagnosis not present

## 2020-05-28 DIAGNOSIS — M25661 Stiffness of right knee, not elsewhere classified: Secondary | ICD-10-CM

## 2020-05-28 NOTE — Therapy (Signed)
[facility] [place], [place], [address] Phone: [phone]   Fax:  [phone]  Physical Therapy Treatment  Patient Details  Name: Jenna Bentley MRN: [id number] Date of Birth: 04/22/1951 Referring Provider (PT): Edmonia Lynch MD   Encounter Date: 05/28/2020   PT End of Session - 05/28/20 1427    Visit Number 5    Number of Visits 12    Date for PT Re-Evaluation 08/13/20    Authorization Type FOTO AT LEAST EVERY 5TH VISIT.  PROGRESS NOTE AT 10TH VISIT.  KX MODIFIER AFTER 15 VISITS.    PT Start Time 0145    PT Stop Time 0241    PT Time Calculation (min) 56 min    Activity Tolerance Patient tolerated treatment well    Behavior During Therapy WFL for tasks assessed/performed           Past Medical History:  Diagnosis Date  . HTN (hypertension)   . S/P rotator cuff repair     No past surgical history on file.  There were no vitals filed for this visit.   Subjective Assessment - 05/28/20 1356    Subjective COVID-19 screen performed prior to patient entering clinic.  Not sleeping well.    Pertinent History HTN, RTC repair, left TKA.    Currently in Pain? Yes    Pain Score 5     Pain Location Knee    Pain Orientation Right    Pain Descriptors / Indicators Aching;Dull    Pain Onset More than a month ago                             [facility] Adult PT Treatment/Exercise - 05/28/20 0001      Exercises   Exercises Knee/Hip      Knee/Hip Exercises: Aerobic   Recumbent Bike Level 3 moving seat forward as tolerated over 15 minutes to seat 3.      Knee/Hip Exercises: Machines for Strengthening   Cybex Knee Extension 10# x 3 minutes.    Cybex Knee Flexion 30# x 3 minutes.      Modalities   Modalities Psychologist, educational Location RT knee.    Electrical Stimulation Action IFC    Electrical Stimulation Parameters 80-150 Hz on  40% scan x 20 minutes.    Electrical Stimulation Goals Edema;Pain      Manual Therapy   Manual Therapy Passive ROM    Manual therapy comments In supine:  PROM to patient's right knee x 5 minutes.                       PT Long Term Goals - 05/14/20 1523      PT LONG TERM GOAL #1   Title Independent with a HEP.    Time 6    Period Weeks    Status New      PT LONG TERM GOAL #3   Title Full active right  knee extension in order to normalize gait.    Time 6    Period Weeks    Status New      PT LONG TERM GOAL #4   Title Active right knee flexion to 120 degrees+ so the patient can perform functional tasks and do so with pain not > 2-3/10.    Time 6    Period Weeks    Status New  PT LONG TERM GOAL #5   Title Increase right knee strength to a solid 5/5 to provide good stability for accomplishment of functional activities.    Time 6    Period Weeks    Status New                 Plan - 05/28/20 1425    Clinical Impression Statement Excellent job today wiht the addition os weight machines.  Patient performed with excellent technique and no complaints.    Personal Factors and Comorbidities Comorbidity 1;Comorbidity 2    Comorbidities HTN, RTC repair, left TKA.    Examination-Activity Limitations Other;Locomotion Level    Examination-Participation Restrictions Other    Stability/Clinical Decision Making Stable/Uncomplicated    Rehab Potential Excellent    PT Frequency 2x / week    PT Duration 6 weeks    PT Treatment/Interventions ADLs/Self Care Home Management;Cryotherapy;Electrical Stimulation;Moist Heat;Gait training;Stair training;Functional mobility training;Therapeutic activities;Therapeutic exercise;Neuromuscular re-education;Manual techniques;Patient/family education;Passive range of motion;Vasopneumatic Device    PT Next Visit Plan Progress into TKA protocol.  She should be able to progress to bike soon.  Vasopnuematic.    Consulted and Agree with  Plan of Care Patient           Patient will benefit from skilled therapeutic intervention in order to improve the following deficits and impairments:  Pain, Decreased strength, Increased edema, Decreased activity tolerance, Decreased range of motion  Visit Diagnosis: Chronic pain of right knee  Stiffness of right knee, not elsewhere classified  Localized edema     Problem List There are no problems to display for this patient.   Emilea Goga, [place]  MPT 05/28/2020, 2:42 PM  [facility] [address] [place], [place], [address] Phone: [phone]   Fax:  [phone]  Name: Jenna Bentley MRN: [id number] Date of Birth: 1950-12-24

## 2020-05-31 ENCOUNTER — Ambulatory Visit: Payer: Medicare HMO | Attending: Orthopedic Surgery | Admitting: Physical Therapy

## 2020-05-31 ENCOUNTER — Other Ambulatory Visit: Payer: Self-pay

## 2020-05-31 DIAGNOSIS — M25561 Pain in right knee: Secondary | ICD-10-CM | POA: Insufficient documentation

## 2020-05-31 DIAGNOSIS — M25661 Stiffness of right knee, not elsewhere classified: Secondary | ICD-10-CM | POA: Insufficient documentation

## 2020-05-31 DIAGNOSIS — G8929 Other chronic pain: Secondary | ICD-10-CM

## 2020-05-31 DIAGNOSIS — R600 Localized edema: Secondary | ICD-10-CM | POA: Insufficient documentation

## 2020-05-31 NOTE — Therapy (Signed)
[facility] [place], [place], [address] Phone: [phone]   Fax:  [phone]  Physical Therapy Treatment  Patient Details  Name: Jenna Bentley MRN: [id number] Date of Birth: 1951/06/04 Referring Provider (PT): Edmonia Lynch MD   Encounter Date: 05/31/2020   PT End of Session - 05/31/20 1348    Visit Number 6    Number of Visits 12    Date for PT Re-Evaluation 08/13/20    Authorization Type FOTO AT LEAST EVERY 5TH VISIT.  PROGRESS NOTE AT 10TH VISIT.  KX MODIFIER AFTER 15 VISITS.    PT Start Time 0137    PT Stop Time 0230    PT Time Calculation (min) 53 min    Activity Tolerance Patient tolerated treatment well    Behavior During Therapy [facility] for tasks assessed/performed           Past Medical History:  Diagnosis Date  . HTN (hypertension)   . S/P rotator cuff repair     No past surgical history on file.  There were no vitals filed for this visit.   Subjective Assessment - 05/31/20 1347    Subjective COVID-19 screen performed prior to patient entering clinic.  Pain lower today.    Pertinent History HTN, RTC repair, left TKA.    How long can you walk comfortably? Around house with walker.    Patient Stated Goals Get back to normal life.    Currently in Pain? Yes    Pain Score 2     Pain Location Knee    Pain Orientation Right    Pain Descriptors / Indicators Aching;Dull    Pain Type Surgical pain    Pain Onset More than a month ago                             [facility] Adult PT Treatment/Exercise - 05/31/20 0001      Exercises   Exercises Knee/Hip      Knee/Hip Exercises: Aerobic   Recumbent Bike 15 minutes.      Knee/Hip Exercises: Machines for Strengthening   Cybex Knee Extension 10# x 3 minutes.    Cybex Knee Flexion 40# x 3 minutes.      Modalities   Modalities Health visitor Stimulation Location Right knee.      Electrical Stimulation Action IFC    Electrical Stimulation Parameters 80-150 at 40% scan x 20 minutes.    Electrical Stimulation Goals Edema;Pain      Vasopneumatic   Number Minutes Vasopneumatic  20 minutes    Vasopnuematic Location  --   Right knee.   Vasopneumatic Pressure Medium      Manual Therapy   Manual Therapy Passive ROM    Passive ROM PROM x 3 minutes to patient's right knee.                       PT Long Term Goals - 05/14/20 1523      PT LONG TERM GOAL #1   Title Independent with a HEP.    Time 6    Period Weeks    Status New      PT LONG TERM GOAL #3   Title Full active right  knee extension in order to normalize gait.    Time 6    Period Weeks    Status New  PT LONG TERM GOAL #4   Title Active right knee flexion to 120 degrees+ so the patient can perform functional tasks and do so with pain not > 2-3/10.    Time 6    Period Weeks    Status New      PT LONG TERM GOAL #5   Title Increase right knee strength to a solid 5/5 to provide good stability for accomplishment of functional activities.    Time 6    Period Weeks    Status New                 Plan - 05/31/20 1417    Clinical Impression Statement The patient is making excellent progress.  Her pain is lowered today.    Personal Factors and Comorbidities Comorbidity 1;Comorbidity 2    Comorbidities HTN, RTC repair, left TKA.    Examination-Activity Limitations Other;Locomotion Level           Patient will benefit from skilled therapeutic intervention in order to improve the following deficits and impairments:     Visit Diagnosis: Chronic pain of right knee  Stiffness of right knee, not elsewhere classified  Localized edema     Problem List There are no problems to display for this patient.   Garlen Reinig, [place] MPT 05/31/2020, 2:40 PM  [facility] [address] [place], [place], [address] Phone: [phone]   Fax:   [phone]  Name: Jenna Bentley MRN: [id number] Date of Birth: [birth year]

## 2020-06-04 ENCOUNTER — Other Ambulatory Visit: Payer: Self-pay

## 2020-06-04 ENCOUNTER — Ambulatory Visit: Payer: Medicare HMO | Admitting: Physical Therapy

## 2020-06-04 ENCOUNTER — Encounter: Payer: Self-pay | Admitting: Physical Therapy

## 2020-06-04 DIAGNOSIS — M25561 Pain in right knee: Secondary | ICD-10-CM | POA: Diagnosis not present

## 2020-06-04 DIAGNOSIS — R600 Localized edema: Secondary | ICD-10-CM

## 2020-06-04 DIAGNOSIS — M25661 Stiffness of right knee, not elsewhere classified: Secondary | ICD-10-CM

## 2020-06-04 DIAGNOSIS — G8929 Other chronic pain: Secondary | ICD-10-CM

## 2020-06-04 NOTE — Therapy (Signed)
[facility] [place], [place], [address] Phone: [phone]   Fax:  [phone]  Physical Therapy Treatment  Patient Details  Name: Jenna Bentley MRN: [id number] Date of Birth: 1951/05/01 Referring Provider (PT): Edmonia Lynch MD   Encounter Date: 06/04/2020   PT End of Session - 06/04/20 1539    Visit Number 7    Number of Visits 12    Date for PT Re-Evaluation 08/13/20    Authorization Type FOTO AT LEAST EVERY 5TH VISIT.  PROGRESS NOTE AT 10TH VISIT.  KX MODIFIER AFTER 15 VISITS.    PT Start Time 1433    PT Stop Time 1517    PT Time Calculation (min) 44 min    Activity Tolerance Patient tolerated treatment well    Behavior During Therapy WFL for tasks assessed/performed           Past Medical History:  Diagnosis Date  . HTN (hypertension)   . S/P rotator cuff repair     History reviewed. No pertinent surgical history.  There were no vitals filed for this visit.   Subjective Assessment - 06/04/20 1435    Subjective COVID-19 screen performed prior to patient entering clinic. Reports stiffness after sitting a lot.    Pertinent History HTN, RTC repair, left TKA.    How long can you walk comfortably? Around house with walker.    Patient Stated Goals Get back to normal life.    Currently in Pain? No/denies              [facility] PT Assessment - 06/04/20 0001      Assessment   Medical Diagnosis Right total knee replacement.    Referring Provider (PT) Edmonia Lynch MD    Onset Date/Surgical Date 05/10/20    Next MD Visit 06/20/2020      Precautions   Precaution Comments No ultrasound.      Restrictions   Weight Bearing Restrictions No                         [facility] Adult PT Treatment/Exercise - 06/04/20 0001      Knee/Hip Exercises: Aerobic   Recumbent Bike L3, seat 5 x15 min      Knee/Hip Exercises: Machines for Strengthening   Cybex Knee Extension 10# 3x10 reps    Cybex Knee  Flexion 30# 3x10 reps    Cybex Leg Press 2 pl ,seat 7 x20 reps      Knee/Hip Exercises: Standing   Terminal Knee Extension Strengthening;Right;20 reps;Limitations    Terminal Knee Extension Limitations Orange XTS      Modalities   Modalities Vasopneumatic      Vasopneumatic   Number Minutes Vasopneumatic  10 minutes    Vasopnuematic Location  Knee    Vasopneumatic Pressure Low    Vasopneumatic Temperature  34/edema                       PT Long Term Goals - 05/14/20 1523      PT LONG TERM GOAL #1   Title Independent with a HEP.    Time 6    Period Weeks    Status New      PT LONG TERM GOAL #3   Title Full active right  knee extension in order to normalize gait.    Time 6    Period Weeks    Status New      PT LONG TERM GOAL #  4   Title Active right knee flexion to 120 degrees+ so the patient can perform functional tasks and do so with pain not > 2-3/10.    Time 6    Period Weeks    Status New      PT LONG TERM GOAL #5   Title Increase right knee strength to a solid 5/5 to provide good stability for accomplishment of functional activities.    Time 6    Period Weeks    Status New                 Plan - 06/04/20 1540    Clinical Impression Statement Patient presented in clinic with reports of only stiffness after sitting longer today. Patient guided through more hip/knee strengthening without complaint of pain. Prolonged end range hold instructed with TKE. Normal vasopnuematic response noted following removal of the modality. Patient did not report cramping during today's treatment.    Personal Factors and Comorbidities Comorbidity 1;Comorbidity 2    Comorbidities HTN, RTC repair, left TKA.    Examination-Activity Limitations Other;Locomotion Level    Examination-Participation Restrictions Other    Stability/Clinical Decision Making Stable/Uncomplicated    Rehab Potential Excellent    PT Frequency 2x / week    PT Duration 6 weeks    PT  Treatment/Interventions ADLs/Self Care Home Management;Cryotherapy;Electrical Stimulation;Moist Heat;Gait training;Stair training;Functional mobility training;Therapeutic activities;Therapeutic exercise;Neuromuscular re-education;Manual techniques;Patient/family education;Passive range of motion;Vasopneumatic Device    PT Next Visit Plan Progress into TKA protocol.  She should be able to progress to bike soon.  Vasopnuematic.    Consulted and Agree with Plan of Care Patient           Patient will benefit from skilled therapeutic intervention in order to improve the following deficits and impairments:  Pain, Decreased strength, Increased edema, Decreased activity tolerance, Decreased range of motion  Visit Diagnosis: Chronic pain of right knee  Stiffness of right knee, not elsewhere classified  Localized edema     Problem List There are no problems to display for this patient.   Standley Brooking, PTA 06/04/2020, 3:43 PM  [facility] [address] [place], [place], [address] Phone: [phone]   Fax:  [phone]  Name: Jenna Bentley MRN: [id number] Date of Birth: 1950/07/18

## 2020-06-07 ENCOUNTER — Other Ambulatory Visit: Payer: Self-pay

## 2020-06-07 ENCOUNTER — Encounter: Payer: Self-pay | Admitting: Physical Therapy

## 2020-06-07 ENCOUNTER — Ambulatory Visit: Payer: Medicare HMO | Admitting: Physical Therapy

## 2020-06-07 DIAGNOSIS — G8929 Other chronic pain: Secondary | ICD-10-CM

## 2020-06-07 DIAGNOSIS — R600 Localized edema: Secondary | ICD-10-CM

## 2020-06-07 DIAGNOSIS — M25561 Pain in right knee: Secondary | ICD-10-CM | POA: Diagnosis not present

## 2020-06-07 DIAGNOSIS — M25661 Stiffness of right knee, not elsewhere classified: Secondary | ICD-10-CM

## 2020-06-07 NOTE — Therapy (Signed)
[facility] [place], [place], [address] Phone: [phone]   Fax:  [phone]  Physical Therapy Treatment  Patient Details  Name: Jenna Bentley MRN: [id number] Date of Birth: September 15, 1950 Referring Provider (PT): Edmonia Lynch MD   Encounter Date: 06/07/2020   PT End of Session - 06/07/20 1433    Visit Number 8    Number of Visits 12    Date for PT Re-Evaluation 08/13/20    Authorization Type FOTO AT LEAST EVERY 5TH VISIT.  PROGRESS NOTE AT 10TH VISIT.  KX MODIFIER AFTER 15 VISITS.    PT Start Time 1346    PT Stop Time 1438    PT Time Calculation (min) 52 min    Activity Tolerance Patient tolerated treatment well    Behavior During Therapy WFL for tasks assessed/performed           Past Medical History:  Diagnosis Date  . HTN (hypertension)   . S/P rotator cuff repair     History reviewed. No pertinent surgical history.  There were no vitals filed for this visit.   Subjective Assessment - 06/07/20 1347    Subjective COVID-19 screen performed prior to patient entering clinic. Reports soreness and stiffness as she has been standing a lot today.    Pertinent History HTN, RTC repair, left TKA.    How long can you walk comfortably? Around house with walker.    Patient Stated Goals Get back to normal life.    Currently in Pain? Yes    Pain Score 2     Pain Location Knee    Pain Orientation Right    Pain Descriptors / Indicators Sore    Pain Type Surgical pain    Pain Onset More than a month ago    Pain Frequency Constant              [facility] PT Assessment - 06/07/20 0001      Assessment   Medical Diagnosis Right total knee replacement.    Referring Provider (PT) Edmonia Lynch MD    Onset Date/Surgical Date 05/10/20    Next MD Visit 06/20/2020                         [facility] Adult PT Treatment/Exercise - 06/07/20 0001      Knee/Hip Exercises: Aerobic   Recumbent Bike L3, seat 2 x15 min       Knee/Hip Exercises: Machines for Strengthening   Cybex Knee Extension 10# 3x10 reps    Cybex Knee Flexion 30# 3x10 reps    Cybex Leg Press 2 pl ,seat 7 x20 reps      Knee/Hip Exercises: Standing   Terminal Knee Extension Strengthening;Right;20 reps;Limitations    Terminal Knee Extension Limitations Orange XTS    Step Down Right;Step Height: 4";20 reps;Hand Hold: 2    Other Standing Knee Exercises SL sit <> stands x15 reps      Modalities   Modalities Vasopneumatic      Vasopneumatic   Number Minutes Vasopneumatic  10 minutes    Vasopnuematic Location  Knee    Vasopneumatic Pressure Medium    Vasopneumatic Temperature  34/edema                       PT Long Term Goals - 06/07/20 1433      PT LONG TERM GOAL #1   Title Independent with a HEP.    Time 6  Period Weeks    Status On-going      PT LONG TERM GOAL #3   Title Full active right  knee extension in order to normalize gait.    Time 6    Period Weeks    Status On-going      PT LONG TERM GOAL #4   Title Active right knee flexion to 120 degrees+ so the patient can perform functional tasks and do so with pain not > 2-3/10.    Time 6    Period Weeks    Status On-going      PT LONG TERM GOAL #5   Title Increase right knee strength to a solid 5/5 to provide good stability for accomplishment of functional activities.    Time 6    Period Weeks    Status On-going                 Plan - 06/07/20 1434    Clinical Impression Statement Patient presented in clinic with reports of minimal soreness and stiffness after prolonged walking today. Patient progressed to more functional step downs and SL sit <> stands for greater WB through RLE. Patient able to tolerate step downs from 4" step well without complaint of pain. Normal vasopneumatic response noted following removal of the modality.    Personal Factors and Comorbidities Comorbidity 1;Comorbidity 2    Comorbidities HTN, RTC repair, left TKA.     Examination-Activity Limitations Other;Locomotion Level    Examination-Participation Restrictions Other    Stability/Clinical Decision Making Stable/Uncomplicated    Rehab Potential Excellent    PT Frequency 2x / week    PT Duration 6 weeks    PT Treatment/Interventions ADLs/Self Care Home Management;Cryotherapy;Electrical Stimulation;Moist Heat;Gait training;Stair training;Functional mobility training;Therapeutic activities;Therapeutic exercise;Neuromuscular re-education;Manual techniques;Patient/family education;Passive range of motion;Vasopneumatic Device    PT Next Visit Plan Progress into TKA protocol.  She should be able to progress to bike soon.  Vasopnuematic.    Consulted and Agree with Plan of Care Patient           Patient will benefit from skilled therapeutic intervention in order to improve the following deficits and impairments:  Pain,Decreased strength,Increased edema,Decreased activity tolerance,Decreased range of motion  Visit Diagnosis: Chronic pain of right knee  Stiffness of right knee, not elsewhere classified  Localized edema     Problem List There are no problems to display for this patient.   Standley Brooking, PTA 06/07/2020, 2:51 PM  [facility] [address] [place], [place], [address] Phone: [phone]   Fax:  [phone]  Name: Jenna Bentley MRN: [id number] Date of Birth: 25-Jun-1951

## 2020-06-11 ENCOUNTER — Encounter: Payer: Self-pay | Admitting: Physical Therapy

## 2020-06-11 ENCOUNTER — Other Ambulatory Visit: Payer: Self-pay

## 2020-06-11 ENCOUNTER — Ambulatory Visit: Payer: Medicare HMO | Admitting: Physical Therapy

## 2020-06-11 DIAGNOSIS — G8929 Other chronic pain: Secondary | ICD-10-CM

## 2020-06-11 DIAGNOSIS — M25561 Pain in right knee: Secondary | ICD-10-CM | POA: Diagnosis not present

## 2020-06-11 DIAGNOSIS — R600 Localized edema: Secondary | ICD-10-CM

## 2020-06-11 DIAGNOSIS — M25661 Stiffness of right knee, not elsewhere classified: Secondary | ICD-10-CM

## 2020-06-11 NOTE — Therapy (Signed)
[facility] [place], [place], [address] Phone: [phone]   Fax:  [phone]  Physical Therapy Treatment  Patient Details  Name: Jenna Bentley MRN: [id number] Date of Birth: 1950/10/29 Referring Provider (PT): Edmonia Lynch MD   Encounter Date: 06/11/2020   PT End of Session - 06/11/20 1349    Visit Number 9    Number of Visits 12    Date for PT Re-Evaluation 08/13/20    Authorization Type FOTO AT LEAST EVERY 5TH VISIT.  PROGRESS NOTE AT 10TH VISIT.  KX MODIFIER AFTER 15 VISITS.    PT Start Time 1347    PT Stop Time 1440    PT Time Calculation (min) 53 min    Activity Tolerance Patient tolerated treatment well    Behavior During Therapy WFL for tasks assessed/performed           Past Medical History:  Diagnosis Date  . HTN (hypertension)   . S/P rotator cuff repair     History reviewed. No pertinent surgical history.  There were no vitals filed for this visit.   Subjective Assessment - 06/11/20 1348    Subjective COVID-19 screen performed prior to patient entering clinic. Reports she is already fatigued from being out and shopping most of the day.    Pertinent History HTN, RTC repair, left TKA.    How long can you walk comfortably? Around house with walker.    Patient Stated Goals Get back to normal life.    Currently in Pain? No/denies              [facility] PT Assessment - 06/11/20 0001      Assessment   Medical Diagnosis Right total knee replacement.    Referring Provider (PT) Edmonia Lynch MD    Onset Date/Surgical Date 05/10/20    Next MD Visit 06/20/2020      Precautions   Precaution Comments No ultrasound.      Restrictions   Weight Bearing Restrictions No                         [facility] Adult PT Treatment/Exercise - 06/11/20 0001      Knee/Hip Exercises: Aerobic   Recumbent Bike L4, seat 4 x15 min      Knee/Hip Exercises: Machines for Strengthening   Cybex Knee  Extension 10# 3x10 reps    Cybex Knee Flexion 30# 3x10 reps    Cybex Leg Press 2.5 pl ,seat 7 x30 reps      Knee/Hip Exercises: Standing   Step Down Right;Step Height: 4";20 reps;Hand Hold: 2    Other Standing Knee Exercises R heel dots 4" step x20 reps      Modalities   Modalities Vasopneumatic      Vasopneumatic   Number Minutes Vasopneumatic  15 minutes    Vasopnuematic Location  Knee    Vasopneumatic Pressure Medium    Vasopneumatic Temperature  34/edema                       PT Long Term Goals - 06/07/20 1433      PT LONG TERM GOAL #1   Title Independent with a HEP.    Time 6    Period Weeks    Status On-going      PT LONG TERM GOAL #3   Title Full active right  knee extension in order to normalize gait.    Time 6  Period Weeks    Status On-going      PT LONG TERM GOAL #4   Title Active right knee flexion to 120 degrees+ so the patient can perform functional tasks and do so with pain not > 2-3/10.    Time 6    Period Weeks    Status On-going      PT LONG TERM GOAL #5   Title Increase right knee strength to a solid 5/5 to provide good stability for accomplishment of functional activities.    Time 6    Period Weeks    Status On-going                 Plan - 06/11/20 1518    Clinical Impression Statement Patient progressed to more reps of therex to improve quad strengthening. Patient has not started ambulating her stairs at home as she has been too fatigued from shopping and other activities. Patient required mod multimodal cueing for step activities especially to correct technique and target the quad specifically. Normal vasopneumatic response after prolonged walking today.    Personal Factors and Comorbidities Comorbidity 1;Comorbidity 2    Comorbidities HTN, RTC repair, left TKA.    Examination-Activity Limitations Other;Locomotion Level    Examination-Participation Restrictions Other    Stability/Clinical Decision Making  Stable/Uncomplicated    Rehab Potential Excellent    PT Frequency 2x / week    PT Duration 6 weeks    PT Treatment/Interventions ADLs/Self Care Home Management;Cryotherapy;Electrical Stimulation;Moist Heat;Gait training;Stair training;Functional mobility training;Therapeutic activities;Therapeutic exercise;Neuromuscular re-education;Manual techniques;Patient/family education;Passive range of motion;Vasopneumatic Device    PT Next Visit Plan Progress into TKA protocol.  She should be able to progress to bike soon.  Vasopnuematic.    Consulted and Agree with Plan of Care Patient           Patient will benefit from skilled therapeutic intervention in order to improve the following deficits and impairments:  Pain,Decreased strength,Increased edema,Decreased activity tolerance,Decreased range of motion  Visit Diagnosis: Chronic pain of right knee  Stiffness of right knee, not elsewhere classified  Localized edema     Problem List There are no problems to display for this patient.   Standley Brooking, PTA 06/11/2020, 3:22 PM  [facility] [address] [place], [place], [address] Phone: [phone]   Fax:  [phone]  Name: Jenna Bentley MRN: [id number] Date of Birth: 09-Oct-1950

## 2020-06-14 ENCOUNTER — Encounter: Payer: Self-pay | Admitting: Physical Therapy

## 2020-06-14 ENCOUNTER — Other Ambulatory Visit: Payer: Self-pay

## 2020-06-14 ENCOUNTER — Ambulatory Visit: Payer: Medicare HMO | Admitting: Physical Therapy

## 2020-06-14 DIAGNOSIS — R600 Localized edema: Secondary | ICD-10-CM

## 2020-06-14 DIAGNOSIS — M25561 Pain in right knee: Secondary | ICD-10-CM | POA: Diagnosis not present

## 2020-06-14 DIAGNOSIS — M25661 Stiffness of right knee, not elsewhere classified: Secondary | ICD-10-CM

## 2020-06-14 DIAGNOSIS — G8929 Other chronic pain: Secondary | ICD-10-CM

## 2020-06-14 NOTE — Therapy (Signed)
[facility] [place], [place], [address] Phone: [phone]   Fax:  [phone]  Physical Therapy Treatment  Patient Details  Name: Jenna Bentley MRN: [id number] Date of Birth: Jun 25, 1951 Referring Provider (PT): Edmonia Lynch MD   Encounter Date: 06/14/2020   PT End of Session - 06/14/20 1342    Visit Number 10    Number of Visits 12    Date for PT Re-Evaluation 08/13/20    Authorization Type FOTO AT LEAST EVERY 5TH VISIT.  PROGRESS NOTE AT 10TH VISIT.  KX MODIFIER AFTER 15 VISITS.    PT Start Time 1346    PT Stop Time 1430    PT Time Calculation (min) 44 min    Activity Tolerance Patient tolerated treatment well    Behavior During Therapy WFL for tasks assessed/performed           Past Medical History:  Diagnosis Date  . HTN (hypertension)   . S/P rotator cuff repair     History reviewed. No pertinent surgical history.  There were no vitals filed for this visit.   Subjective Assessment - 06/14/20 1342    Subjective COVID-19 screen performed prior to patient entering clinic. Patient reports having difficulty sleeping.    Pertinent History HTN, RTC repair, left TKA.    How long can you walk comfortably? Around house with walker.    Patient Stated Goals Get back to normal life.    Currently in Pain? No/denies              [facility] PT Assessment - 06/14/20 0001      Assessment   Medical Diagnosis Right total knee replacement.    Referring Provider (PT) Edmonia Lynch MD    Onset Date/Surgical Date 05/10/20    Next MD Visit 06/27/2020      Precautions   Precaution Comments No ultrasound.      Restrictions   Weight Bearing Restrictions No      Observation/Other Assessments   Focus on Therapeutic Outcomes (FOTO)  31%, CJ status      ROM / Strength   AROM / PROM / Strength AROM      AROM   Overall AROM  Within functional limits for tasks performed    AROM Assessment Site Knee    Right/Left Knee  Right    Right Knee Extension 0    Right Knee Flexion 120                         [facility] Adult PT Treatment/Exercise - 06/14/20 0001      Knee/Hip Exercises: Aerobic   Recumbent Bike L4, seat 2 x15 min      Knee/Hip Exercises: Machines for Strengthening   Cybex Knee Extension 20# 2x10 reps    Cybex Knee Flexion 40# 2x10 reps    Cybex Leg Press 2.5 pl, seat 7 x20 reps      Knee/Hip Exercises: Standing   Step Down Right;20 reps;Hand Hold: 2;Step Height: 4"      Knee/Hip Exercises: Seated   Sit to Sand 15 reps;without UE support   RLE, L toe touch only     Knee/Hip Exercises: Sidelying   Hip ABduction AROM;Right;20 reps      Modalities   Modalities Vasopneumatic      Vasopneumatic   Number Minutes Vasopneumatic  10 minutes    Vasopnuematic Location  Knee    Vasopneumatic Pressure Low    Vasopneumatic Temperature  34/edema  PT Long Term Goals - 06/14/20 1430      PT LONG TERM GOAL #1   Title Independent with a HEP.    Time 6    Period Weeks    Status On-going      PT LONG TERM GOAL #3   Title Full active right  knee extension in order to normalize gait.    Time 6    Period Weeks    Status Achieved      PT LONG TERM GOAL #4   Title Active right knee flexion to 120 degrees+ so the patient can perform functional tasks and do so with pain not > 2-3/10.    Time 6    Period Weeks    Status Achieved      PT LONG TERM GOAL #5   Title Increase right knee strength to a solid 5/5 to provide good stability for accomplishment of functional activities.    Time 6    Period Weeks    Status On-going                 Plan - 06/14/20 1506    Clinical Impression Statement Patient presented in clinic with continued reports of difficulty sleeping. Patient progressed through strengthening without complaint of pain. AROM of R knee measured as 0-120 deg today. Patient now sleeping upstairs in her usual bedroom and reported some  soreness after now having to ambulate stairs multiple times per day. Normal vasopneumatic response noted following removal of the modality.    Personal Factors and Comorbidities Comorbidity 1;Comorbidity 2    Comorbidities HTN, RTC repair, left TKA.    Examination-Activity Limitations Other;Locomotion Level    Examination-Participation Restrictions Other    Stability/Clinical Decision Making Stable/Uncomplicated    Rehab Potential Excellent    PT Frequency 2x / week    PT Duration 6 weeks    PT Treatment/Interventions ADLs/Self Care Home Management;Cryotherapy;Electrical Stimulation;Moist Heat;Gait training;Stair training;Functional mobility training;Therapeutic activities;Therapeutic exercise;Neuromuscular re-education;Manual techniques;Patient/family education;Passive range of motion;Vasopneumatic Device    PT Next Visit Plan Progress into TKA protocol.  She should be able to progress to bike soon.  Vasopnuematic.    Consulted and Agree with Plan of Care Patient           Patient will benefit from skilled therapeutic intervention in order to improve the following deficits and impairments:  Pain,Decreased strength,Increased edema,Decreased activity tolerance,Decreased range of motion  Visit Diagnosis: Chronic pain of right knee  Stiffness of right knee, not elsewhere classified  Localized edema     Problem List There are no problems to display for this patient.   Standley Brooking, PTA 06/14/2020, 4:05 PM  [facility] [address] [place], [place], [address] Phone: [phone]   Fax:  [phone]  Name: Jenna Bentley MRN: [id number] Date of Birth: 06-15-1951  Progress Note Reporting Period 05/14/20 to 06/14/20  See note below for Objective Data and Assessment of Progress/Goals.   Excellent progress.  Goals #3 and 4 met.    Mali Applegate MPT

## 2020-06-20 ENCOUNTER — Ambulatory Visit: Payer: Medicare HMO | Admitting: Physical Therapy

## 2020-06-20 ENCOUNTER — Other Ambulatory Visit: Payer: Self-pay

## 2020-06-20 DIAGNOSIS — G8929 Other chronic pain: Secondary | ICD-10-CM

## 2020-06-20 DIAGNOSIS — M25561 Pain in right knee: Secondary | ICD-10-CM

## 2020-06-20 DIAGNOSIS — R600 Localized edema: Secondary | ICD-10-CM

## 2020-06-20 DIAGNOSIS — M25661 Stiffness of right knee, not elsewhere classified: Secondary | ICD-10-CM

## 2020-06-20 NOTE — Therapy (Signed)
[facility] [place], [place], [address] Phone: [phone]   Fax:  [phone]  Physical Therapy Treatment  Patient Details  Name: ALYSSABETH BEZY MRN: [id number] Date of Birth: 11-03-1950 Referring Provider (PT): Edmonia Lynch MD   Encounter Date: 06/20/2020   PT End of Session - 06/20/20 1119    Visit Number 11    Number of Visits 12    Date for PT Re-Evaluation 08/13/20    Authorization Type FOTO AT LEAST EVERY 5TH VISIT.  PROGRESS NOTE AT 10TH VISIT.  KX MODIFIER AFTER 15 VISITS.    PT Start Time 0945    PT Stop Time 1028    PT Time Calculation (min) 43 min    Activity Tolerance Patient tolerated treatment well    Behavior During Therapy WFL for tasks assessed/performed           Past Medical History:  Diagnosis Date  . HTN (hypertension)   . S/P rotator cuff repair     No past surgical history on file.  There were no vitals filed for this visit.   Subjective Assessment - 06/20/20 1113    Subjective COVID-19 screen performed prior to patient entering clinic.  Still having some trouble sleeping.    Pertinent History HTN, RTC repair, left TKA.    Patient Stated Goals Get back to normal life.    Currently in Pain? Yes    Pain Score 2     Pain Orientation Right    Pain Descriptors / Indicators Sore    Pain Type Surgical pain    Pain Onset More than a month ago              [facility] PT Assessment - 06/20/20 0001      AROM   AROM Assessment Site Knee    Right/Left Knee Right    Right Knee Extension 0    Right Knee Flexion 120   Passive 125 degrees.                        [facility] Adult PT Treatment/Exercise - 06/20/20 0001      Exercises   Exercises Knee/Hip      Knee/Hip Exercises: Aerobic   Recumbent Bike Level 5 x 15 minutes.      Knee/Hip Exercises: Machines for Strengthening   Cybex Knee Extension 10# x 3 minutes.    Cybex Knee Flexion 30# x 3 minutes.    Cybex Leg Press  2.5 plates x 3 minutes.      Vasopneumatic   Number Minutes Vasopneumatic  15 minutes    Vasopnuematic Location  --   Right knee.   Vasopneumatic Pressure Low                       PT Long Term Goals - 06/14/20 1430      PT LONG TERM GOAL #1   Title Independent with a HEP.    Time 6    Period Weeks    Status On-going      PT LONG TERM GOAL #3   Title Full active right  knee extension in order to normalize gait.    Time 6    Period Weeks    Status Achieved      PT LONG TERM GOAL #4   Title Active right knee flexion to 120 degrees+ so the patient can perform functional tasks and do so with pain not > 2-3/10.  Time 6    Period Weeks    Status Achieved      PT LONG TERM GOAL #5   Title Increase right knee strength to a solid 5/5 to provide good stability for accomplishment of functional activities.    Time 6    Period Weeks    Status On-going                 Plan - 06/20/20 1126    Clinical Impression Statement Excellent job.  Passive right knee flexion to 125 degrees.    Personal Factors and Comorbidities Comorbidity 1;Comorbidity 2    Comorbidities HTN, RTC repair, left TKA.    Examination-Participation Restrictions Other    Stability/Clinical Decision Making Stable/Uncomplicated    Rehab Potential Excellent    PT Frequency 2x / week    PT Duration 6 weeks    PT Treatment/Interventions ADLs/Self Care Home Management;Cryotherapy;Electrical Stimulation;Moist Heat;Gait training;Stair training;Functional mobility training;Therapeutic activities;Therapeutic exercise;Neuromuscular re-education;Manual techniques;Patient/family education;Passive range of motion;Vasopneumatic Device    Consulted and Agree with Plan of Care Patient           Patient will benefit from skilled therapeutic intervention in order to improve the following deficits and impairments:  Pain,Decreased strength,Increased edema,Decreased activity tolerance,Decreased range of  motion  Visit Diagnosis: Chronic pain of right knee  Stiffness of right knee, not elsewhere classified  Localized edema     Problem List There are no problems to display for this patient.   Lajean Boese, [place] MPT 06/20/2020, 11:41 AM  [facility] [address] [place], [place], [address] Phone: [phone]   Fax:  [phone]  Name: KATHARYN SCHAUER MRN: [id number] Date of Birth: 1950-07-28

## 2020-06-26 ENCOUNTER — Ambulatory Visit: Payer: Medicare HMO | Admitting: Physical Therapy

## 2020-06-26 ENCOUNTER — Other Ambulatory Visit: Payer: Self-pay

## 2020-06-26 DIAGNOSIS — M25561 Pain in right knee: Secondary | ICD-10-CM | POA: Diagnosis not present

## 2020-06-26 DIAGNOSIS — M25661 Stiffness of right knee, not elsewhere classified: Secondary | ICD-10-CM

## 2020-06-26 DIAGNOSIS — R600 Localized edema: Secondary | ICD-10-CM

## 2020-06-26 DIAGNOSIS — G8929 Other chronic pain: Secondary | ICD-10-CM

## 2020-06-26 NOTE — Therapy (Addendum)
[facility] [place], [place], [address] Phone: [phone]   Fax:  [phone]  Physical Therapy Treatment  Patient Details  Name: Jenna Bentley MRN: [id number] Date of Birth: [birth year] Referring Provider (PT): Edmonia Lynch MD   Encounter Date: 06/26/2020   PT End of Session - 06/26/20 1416     Visit Number 12    Number of Visits 12    Date for PT Re-Evaluation 08/13/20    Authorization Type FOTO AT LEAST EVERY 5TH VISIT.  PROGRESS NOTE AT 10TH VISIT.  KX MODIFIER AFTER 15 VISITS.    PT Start Time 0145    PT Stop Time 0234    PT Time Calculation (min) 49 min    Activity Tolerance Patient tolerated treatment well    Behavior During Therapy WFL for tasks assessed/performed             Past Medical History:  Diagnosis Date   HTN (hypertension)    S/P rotator cuff repair     No past surgical history on file.  There were no vitals filed for this visit.                      [facility] Adult PT Treatment/Exercise - 06/26/20 0001       Exercises   Exercises Knee/Hip      Knee/Hip Exercises: Aerobic   Recumbent Bike Level 5 x 15 minutes.      Knee/Hip Exercises: Machines for Strengthening   Cybex Knee Extension 10# x 3 minutes.    Cybex Knee Flexion 30# x 3 minutes.    Cybex Leg Press 2 plates x 3 minutes.      Modalities   Modalities Vasopneumatic      Vasopneumatic   Number Minutes Vasopneumatic  18 minutes    Vasopnuematic Location  --   Right knee.   Vasopneumatic Pressure Low                         PT Long Term Goals - 06/26/20 1416       PT LONG TERM GOAL #1   Title Independent with a HEP.    Time 6    Period Weeks    Status Achieved      PT LONG TERM GOAL #2   Title Independent with advanced HEP    Time 8    Period Weeks    Status Achieved      PT LONG TERM GOAL #3   Title Full active right  knee extension in order to normalize gait.    Time 6     Period Weeks    Status Achieved      PT LONG TERM GOAL #4   Title Active right knee flexion to 120 degrees+ so the patient can perform functional tasks and do so with pain not > 2-3/10.    Time 6    Period Weeks    Status Achieved      PT LONG TERM GOAL #5   Title Increase right knee strength to a solid 5/5 to provide good stability for accomplishment of functional activities.    Time 6    Period Weeks    Status Achieved                    Patient will benefit from skilled therapeutic intervention in order to improve the following deficits and impairments:     Visit Diagnosis:  Chronic pain of right knee  Stiffness of right knee, not elsewhere classified  Localized edema     Problem List There are no problems to display for this patient.  PHYSICAL THERAPY DISCHARGE SUMMARY  Visits from Start of Care: 12.  Current functional level related to goals / functional outcomes: See above.   Remaining deficits: All goals met.   Education / Equipment: HEP. Plan: Patient agrees to discharge.  Patient goals were met. Patient is being discharged due to meeting the stated rehab goals.       Solita Macadam, [place] MPT 06/26/2020, 2:56 PM  [facility] [address] [place], [place], [address] Phone: [phone]   Fax:  [phone]  Name: Jenna Bentley MRN: [id number] Date of Birth: Jun 01, 1951

## 2020-10-16 DIAGNOSIS — I129 Hypertensive chronic kidney disease with stage 1 through stage 4 chronic kidney disease, or unspecified chronic kidney disease: Secondary | ICD-10-CM | POA: Insufficient documentation

## 2021-01-21 ENCOUNTER — Other Ambulatory Visit: Payer: Self-pay | Admitting: Family Medicine

## 2021-01-21 DIAGNOSIS — Z1231 Encounter for screening mammogram for malignant neoplasm of breast: Secondary | ICD-10-CM

## 2021-03-12 ENCOUNTER — Other Ambulatory Visit: Payer: Self-pay

## 2021-03-12 ENCOUNTER — Ambulatory Visit
Admission: RE | Admit: 2021-03-12 | Discharge: 2021-03-12 | Disposition: A | Discharge status: Home / Self Care | Payer: Medicare HMO | Source: Ambulatory Visit | Attending: Family Medicine | Admitting: Family Medicine

## 2021-03-12 DIAGNOSIS — Z1231 Encounter for screening mammogram for malignant neoplasm of breast: Secondary | ICD-10-CM

## 2021-06-17 ENCOUNTER — Other Ambulatory Visit: Payer: Self-pay

## 2021-06-19 ENCOUNTER — Encounter: Payer: Self-pay | Admitting: Physician Assistant

## 2021-06-19 ENCOUNTER — Ambulatory Visit: Payer: Medicare HMO | Admitting: Physician Assistant

## 2021-06-19 VITALS — BP 142/80 | HR 84 | Ht 63.39 in | Wt 188.1 lb

## 2021-06-19 DIAGNOSIS — R143 Flatulence: Secondary | ICD-10-CM

## 2021-06-19 DIAGNOSIS — R1084 Generalized abdominal pain: Secondary | ICD-10-CM

## 2021-06-19 DIAGNOSIS — R140 Abdominal distension (gaseous): Secondary | ICD-10-CM | POA: Diagnosis not present

## 2021-06-19 DIAGNOSIS — K219 Gastro-esophageal reflux disease without esophagitis: Secondary | ICD-10-CM | POA: Diagnosis not present

## 2021-06-19 MED ORDER — PLENVU 140 G PO SOLR: ORAL | 0 refills | Status: DC

## 2021-06-19 MED ORDER — HYOSCYAMINE SULFATE 0.125 MG SL SUBL: 0.1250 mg | SUBLINGUAL_TABLET | Freq: Four times a day (QID) | SUBLINGUAL | 2 refills | Status: AC | PRN

## 2021-06-19 NOTE — Progress Notes (Signed)
Chief Complaint: Abdominal pain, GERD, gas and bloating  HPI:    Jenna Bentley is a 70 year old female with past medical history of reflux and others listed below, who was referred to me by Aletha Halim., PA-C for a complaint of abdominal pain, GERD, gas and bloating.    04/23/2021 CBC, hemoglobin A1c, CMP normal.    Today, the patient tells me that she has had a colonoscopy when she turned 51 which was normal.  Since then she has been doing every 3 years Cologuard, her last was 3 years ago and negative.  Describes that she has always said she would have another colonoscopy if anything was going on and now she has developed some GI symptoms.            Tells me that in September she was out eating a salad at the [employer] and immediately, while eating, developed a severe lower abdominal pain which made her get up from the table and leave.  She laid down when she got home and rubbed her stomach and took a nap and it seemed to ease off.  She then would notice that it would hurt anytime that she over ate, then when she would have a salad or anything fried, so she has tried to alter her diet but now it just seems to come on periodically.  Along with this has noticed a great increase in gas from both ends.  She tells me if she rolls over in bed at all she will have gas.  Due to all of the symptoms patient has really limited her diet and they are staying home to eat most meals.  She tells me the only things that seem not to bother it are sweets and bread.  Has tried Gas-X and Tums/Pepcid but these do not seem to make a difference.    Does describe a history of reflux and tells me she had really been off of her Omeprazole, her PCP started this back at the end of October thinking maybe this is related to some of her symptoms.  She has been on Omeprazole 40 mg daily since the end of October and has not noticed a change in symptoms.    Denies fever, chills, change in bowel habits, blood in her stool,  nausea, vomiting or symptoms that awaken her from sleep.   Past Medical History:  Diagnosis Date   GERD (gastroesophageal reflux disease)    HLD (hyperlipidemia)    HTN (hypertension)    S/P rotator cuff repair     Past Surgical History:  Procedure Laterality Date   ROTATOR CUFF REPAIR Right 2015   TOTAL KNEE ARTHROPLASTY Bilateral 2021    Current Outpatient Medications  Medication Sig Dispense Refill   ALISKIREN-AMLODIPINE PO Take by mouth.     amLODipine (NORVASC) 2.5 MG tablet Take 2.5 mg by mouth daily.     famotidine (PEPCID) 20 MG tablet Take 20 mg by mouth daily.     glucosamine-chondroitin 500-400 MG tablet Take 1 tablet by mouth 3 (three) times daily.     Omega-3 1000 MG CAPS Take by mouth.     omeprazole (PRILOSEC) 40 MG capsule Take 40 mg by mouth daily.     Potassium Gluconate 2.5 MEQ TABS Take 1 tablet by mouth daily.     prednisoLONE acetate (PRED FORTE) 1 % ophthalmic suspension Place 1 drop into the right eye 3 (three) times daily.     rosuvastatin (CRESTOR) 20 MG tablet Take 20 mg  by mouth in the morning.     valsartan-hydrochlorothiazide (DIOVAN-HCT) 320-25 MG tablet Take 1 tablet by mouth daily.     No current facility-administered medications for this visit.    Allergies as of 06/19/2021 - Review Complete 06/19/2021  Allergen Reaction Noted   Morphine Other (See Comments) 11/20/2015    No family history on file.  Social History   Socioeconomic History   Marital status: Married    Spouse name: Not on file   Number of children: Not on file   Years of education: Not on file   Highest education level: Not on file  Occupational History   Not on file  Tobacco Use   Smoking status: Not on file   Smokeless tobacco: Not on file  Substance and Sexual Activity   Alcohol use: Not on file   Drug use: Not on file   Sexual activity: Not on file  Other Topics Concern   Not on file  Social History Narrative   Not on file   Social Determinants of Health    Financial Resource Strain: Not on file  Food Insecurity: Not on file  Transportation Needs: Not on file  Physical Activity: Not on file  Stress: Not on file  Social Connections: Not on file  Intimate Partner Violence: Not on file    Review of Systems:    Constitutional: No weight loss, fever or chills Skin: No rash  Cardiovascular: No chest pain Respiratory: No SOB  Gastrointestinal: See HPI and otherwise negative Genitourinary: No dysuria Neurological: No headache, dizziness or syncope Musculoskeletal: No new muscle or joint pain Hematologic: No bleeding Psychiatric: No history of depression or anxiety   Physical Exam:  Vital signs: BP (!) 142/80 (BP Location: Left Arm, Patient Position: Sitting, Cuff Size: Normal)    Pulse 84    Ht 5' 3.39" (1.61 m) Comment: height measured without shoes   Wt 188 lb 2 oz (85.3 kg)    BMI 32.92 kg/m    Constitutional:   Pleasant Caucasian female appears to be in NAD, Well developed, Well nourished, alert and cooperative Head:  Normocephalic and atraumatic. Eyes:   PEERL, EOMI. No icterus. Conjunctiva pink. Ears:  Normal auditory acuity. Neck:  Supple Throat: Oral cavity and pharynx without inflammation, swelling or lesion.  Respiratory: Respirations even and unlabored. Lungs clear to auscultation bilaterally.   No wheezes, crackles, or rhonchi.  Cardiovascular: Normal S1, S2. No MRG. Regular rate and rhythm. No peripheral edema, cyanosis or pallor.  Gastrointestinal:  Soft, nondistended, nontender. No rebound or guarding. Normal bowel sounds. No appreciable masses or hepatomegaly. Rectal:  Not performed.  Msk:  Symmetrical without gross deformities. Without edema, no deformity or joint abnormality.  Neurologic:  Alert and  oriented x4;  grossly normal neurologically.  Skin:   Dry and intact without significant lesions or rashes. Psychiatric: Demonstrates good judgement and reason without abnormal affect or behaviors.  See HPI for recent  labs.  Assessment: 1.  Lower abdominal pain: Ongoing for the past 3 to 4 months after eating a salad, also with excess gas and bloating but interestingly no change in bowel habits; consider SIBO versus IBS versus other 2.  GERD: Chronic for the patient, symptoms controlled on Omeprazole 40 mg daily, no prior endoscopy 3.  Bloating and gas  Plan: 1.  Discussed with patient that there is a possibility she could have SIBO.  Scheduled her for EGD and colonoscopy for further evaluation first, but if these are normal/negative then would recommend SIBO  testing or empiric treatment with Rifaximin. 2.  Patient wished to be scheduled with and establish care with Dr. Hilarie Fredrickson.  Procedures scheduled in the [facility] in the beginning of February.  Did provide the patient a detailed list of risks for the procedures and she agrees to proceed. 3.  Encouraged the patient to continue her Omeprazole 40 mg daily for now. 4.  Prescribed Hyoscyamine sulfate 0.125 mg sublingual tabs to be used as needed every 4-6 hours.  #30 with 3 refills. 5.  Patient follow in clinic per recommendations from Dr. Hilarie Fredrickson after time of procedures.  Ellouise Newer, PA-C [facility] 06/19/2021, 10:32 AM  Cc: Aletha Halim., PA-C

## 2021-06-19 NOTE — Patient Instructions (Signed)
PROCEDURES: You have been scheduled for an EGD and Colonoscopy. Please follow the written instructions given to you at your visit today. Please pick up your prep supplies at the pharmacy within the next 1-3 days. If you use inhalers (even only as needed), please bring them with you on the day of your procedure.  MEDICATION: We have sent the following medication to your pharmacy for you to pick up at your convenience: Hyoscyamine (LEVSIN SL) 0.125 MG SL tablet: Place 1 tablet (0.125 mg total) under the tongue every 4-6 hours as needed.  RECOMMENDATIONS: Continue Omeprazole 40 MG once a day.  It was great seeing you today! Thank you for entrusting me with your care and choosing [facility].  Ellouise Newer, PA  The [facility] providers would like to encourage you to use [facility] to communicate with providers for non-urgent requests or questions.  Due to long hold times on the telephone, sending your provider a message by [facility] may be faster and more efficient way to get a response. Please allow 48 business hours for a response.  Please remember that this is for non-urgent requests/questions. If you are age 16 or older, your body mass index should be between 23-30. Your Body mass index is 32.92 kg/m. If this is out of the aforementioned range listed, please consider follow up with your Primary Care Provider.  If you are age 59 or younger, your body mass index should be between 19-25. Your Body mass index is 32.92 kg/m. If this is out of the aformentioned range listed, please consider follow up with your Primary Care Provider.

## 2021-06-20 NOTE — Progress Notes (Signed)
Addendum: Reviewed and agree with assessment and management plan. Greydis Stlouis M, MD  

## 2021-08-01 ENCOUNTER — Other Ambulatory Visit: Payer: Self-pay

## 2021-08-01 ENCOUNTER — Ambulatory Visit (AMBULATORY_SURGERY_CENTER): Payer: Medicare HMO | Admitting: Internal Medicine

## 2021-08-01 ENCOUNTER — Encounter: Payer: Self-pay | Admitting: Internal Medicine

## 2021-08-01 ENCOUNTER — Other Ambulatory Visit: Payer: Medicare HMO

## 2021-08-01 VITALS — BP 130/66 | HR 68 | Temp 97.5°F | Resp 15 | Ht 63.0 in | Wt 188.0 lb

## 2021-08-01 DIAGNOSIS — R140 Abdominal distension (gaseous): Secondary | ICD-10-CM

## 2021-08-01 DIAGNOSIS — D122 Benign neoplasm of ascending colon: Secondary | ICD-10-CM

## 2021-08-01 DIAGNOSIS — R1084 Generalized abdominal pain: Secondary | ICD-10-CM

## 2021-08-01 DIAGNOSIS — K2970 Gastritis, unspecified, without bleeding: Secondary | ICD-10-CM | POA: Diagnosis not present

## 2021-08-01 DIAGNOSIS — K648 Other hemorrhoids: Secondary | ICD-10-CM | POA: Diagnosis not present

## 2021-08-01 DIAGNOSIS — D125 Benign neoplasm of sigmoid colon: Secondary | ICD-10-CM

## 2021-08-01 DIAGNOSIS — K449 Diaphragmatic hernia without obstruction or gangrene: Secondary | ICD-10-CM | POA: Diagnosis not present

## 2021-08-01 DIAGNOSIS — K219 Gastro-esophageal reflux disease without esophagitis: Secondary | ICD-10-CM

## 2021-08-01 DIAGNOSIS — K635 Polyp of colon: Secondary | ICD-10-CM | POA: Diagnosis not present

## 2021-08-01 DIAGNOSIS — R1030 Lower abdominal pain, unspecified: Secondary | ICD-10-CM | POA: Diagnosis present

## 2021-08-01 DIAGNOSIS — D124 Benign neoplasm of descending colon: Secondary | ICD-10-CM

## 2021-08-01 DIAGNOSIS — D120 Benign neoplasm of cecum: Secondary | ICD-10-CM

## 2021-08-01 MED ORDER — SODIUM CHLORIDE 0.9 % IV SOLN: 500.0000 mL | Freq: Once | INTRAVENOUS | Status: DC

## 2021-08-01 NOTE — Progress Notes (Signed)
Called to room to assist during endoscopic procedure.  Patient ID and intended procedure confirmed with present staff. Received instructions for my participation in the procedure from the performing physician.  

## 2021-08-01 NOTE — Progress Notes (Signed)
GASTROENTEROLOGY PROCEDURE H&P NOTE   Primary Care Physician: Aletha Halim., PA-C    Reason for Procedure:  Lower abdominal pain, abdominal bloating and GERD symptoms  Plan:    EGD and colonoscopy  Patient is appropriate for endoscopic procedure(s) in the ambulatory ([place]) setting.  The nature of the procedure, as well as the risks, benefits, and alternatives were carefully and thoroughly reviewed with the patient. Ample time for discussion and questions allowed. The patient understood, was satisfied, and agreed to proceed.     HPI: Jenna Bentley is a 71 y.o. female who presents for EGD and colonoscopy.  Medical history as below.  Tolerated the prep.  Seen in the office on 06/19/2021 by Ellouise Newer, PA-C.  See that note for details.  No significant change in medical history since that time.  Tolerated the prep.  No recent chest pain or shortness of breath.  Past Medical History:  Diagnosis Date   GERD (gastroesophageal reflux disease)    HLD (hyperlipidemia)    HTN (hypertension)    S/P rotator cuff repair     Past Surgical History:  Procedure Laterality Date   ROTATOR CUFF REPAIR Right 2015   TOTAL KNEE ARTHROPLASTY Bilateral 2021    Prior to Admission medications   Medication Sig Start Date End Date Taking? Authorizing Provider  amLODipine (NORVASC) 2.5 MG tablet Take 2.5 mg by mouth daily. 12/24/20  Yes [provider]  glucosamine-chondroitin 500-400 MG tablet Take 1 tablet by mouth 3 (three) times daily.   Yes [provider]  Omega-3 1000 MG CAPS Take by mouth.   Yes [provider]  omeprazole (PRILOSEC) 40 MG capsule Take 40 mg by mouth daily.   Yes [provider]  Potassium Gluconate 2.5 MEQ TABS Take 1 tablet by mouth daily.   Yes [provider]  valsartan-hydrochlorothiazide (DIOVAN-HCT) 320-25 MG tablet Take 1 tablet by mouth daily. 01/14/21  Yes [provider]  ALISKIREN-AMLODIPINE PO Take by  mouth.    [provider]  famotidine (PEPCID) 20 MG tablet Take 20 mg by mouth daily.    [provider]  hyoscyamine (LEVSIN SL) 0.125 MG SL tablet Place 1 tablet (0.125 mg total) under the tongue every 6 (six) hours as needed. 06/19/21   Levin Erp, PA  prednisoLONE acetate (PRED FORTE) 1 % ophthalmic suspension Place 1 drop into the right eye 3 (three) times daily. 06/07/21   [provider]  rosuvastatin (CRESTOR) 20 MG tablet Take 20 mg by mouth in the morning. 10/18/20   [provider]    Current Outpatient Medications  Medication Sig Dispense Refill   amLODipine (NORVASC) 2.5 MG tablet Take 2.5 mg by mouth daily.     glucosamine-chondroitin 500-400 MG tablet Take 1 tablet by mouth 3 (three) times daily.     Omega-3 1000 MG CAPS Take by mouth.     omeprazole (PRILOSEC) 40 MG capsule Take 40 mg by mouth daily.     Potassium Gluconate 2.5 MEQ TABS Take 1 tablet by mouth daily.     valsartan-hydrochlorothiazide (DIOVAN-HCT) 320-25 MG tablet Take 1 tablet by mouth daily.     ALISKIREN-AMLODIPINE PO Take by mouth.     famotidine (PEPCID) 20 MG tablet Take 20 mg by mouth daily.     hyoscyamine (LEVSIN SL) 0.125 MG SL tablet Place 1 tablet (0.125 mg total) under the tongue every 6 (six) hours as needed. 30 tablet 2   prednisoLONE acetate (PRED FORTE) 1 % ophthalmic  suspension Place 1 drop into the right eye 3 (three) times daily.     rosuvastatin (CRESTOR) 20 MG tablet Take 20 mg by mouth in the morning.     Current Facility-Administered Medications  Medication Dose Route Frequency Provider Last Rate Last Admin   0.9 %  sodium chloride infusion  500 mL Intravenous Once Paullette Mckain, Lajuan Lines, MD        Allergies as of 08/01/2021 - Review Complete 08/01/2021  Allergen Reaction Noted   Morphine Other (See Comments) 11/20/2015    Family History  Problem Relation Age of Onset   Breast cancer Mother    Diabetes Mother    Heart disease Father     Breast cancer Sister    Heart disease Brother    Stroke Brother    Hypertension Daughter    Heart disease Daughter     Social History   Socioeconomic History   Marital status: Married    Spouse name: Not on file   Number of children: 2   Years of education: Not on file   Highest education level: Not on file  Occupational History   Occupation: retired  Tobacco Use   Smoking status: Never   Smokeless tobacco: Never  Vaping Use   Vaping Use: Never used  Substance and Sexual Activity   Alcohol use: Yes    Comment: social   Drug use: Not on file   Sexual activity: Not on file  Other Topics Concern   Not on file  Social History Narrative   Not on file   Social Determinants of Health   Financial Resource Strain: Not on file  Food Insecurity: Not on file  Transportation Needs: Not on file  Physical Activity: Not on file  Stress: Not on file  Social Connections: Not on file  Intimate Partner Violence: Not on file    Physical Exam: Vital signs in last 24 hours: @BP  132/78    Pulse 89    Temp (!) 97.5 F (36.4 C)    Ht 5\' 3"  (1.6 m)    Wt 188 lb (85.3 kg)    SpO2 98%    BMI 33.30 kg/m  GEN: NAD EYE: Sclerae anicteric ENT: MMM CV: Non-tachycardic Pulm: CTA b/l GI: Soft, NT/ND NEURO:  Alert & Oriented x 3   Zenovia Jarred, MD [facility]  08/01/2021 3:06 PM

## 2021-08-01 NOTE — Progress Notes (Signed)
Pt's states no medical or surgical changes since previsit or office visit. 

## 2021-08-01 NOTE — Op Note (Signed)
[place] Patient Name: Jenna Bentley Procedure Date: 08/01/2021 3:10 PM MRN: [id number] Endoscopist: Jerene Bears , MD Age: 71 Referring MD:  Date of Birth: [birth year] Gender: Female Account #: [id number] Procedure:                Colonoscopy Indications:              Lower abdominal pain, abdominal bloating Medicines:                Monitored Anesthesia Care Procedure:                Pre-Anesthesia Assessment:                           - Prior to the procedure, a History and Physical                            was performed, and patient medications and                            allergies were reviewed. The patient's tolerance of                            previous anesthesia was also reviewed. The risks                            and benefits of the procedure and the sedation                            options and risks were discussed with the patient.                            All questions were answered, and informed consent                            was obtained. Prior Anticoagulants: The patient has                            taken no previous anticoagulant or antiplatelet                            agents. ASA Grade Assessment: II - A patient with                            mild systemic disease. After reviewing the risks                            and benefits, the patient was deemed in                            satisfactory condition to undergo the procedure.                           After obtaining informed consent, the colonoscope  was passed under direct vision. Throughout the                            procedure, the patient's blood pressure, pulse, and                            oxygen saturations were monitored continuously. The                            Olympus [id number]-[id number] (#[id number]) Colonoscope was                            introduced through the anus and advanced to the                            terminal ileum. The  colonoscopy was performed                            without difficulty. The patient tolerated the                            procedure well. The quality of the bowel                            preparation was good. The ileocecal valve,                            appendiceal orifice, and rectum were photographed. Scope In: 3:24:55 PM Scope Out: 3:40:10 PM Scope Withdrawal Time: 0 hours 12 minutes 58 seconds  Total Procedure Duration: 0 hours 15 minutes 15 seconds  Findings:                 The digital rectal exam was normal.                           A 6 mm polyp was found in the cecum. The polyp was                            sessile. The polyp was removed with a cold snare.                            Resection and retrieval were complete.                           A 4 mm polyp was found in the ascending colon. The                            polyp was sessile. The polyp was removed with a                            cold snare. Resection and retrieval were complete.                           A 7  mm polyp was found in the descending colon. The                            polyp was sessile. The polyp was removed with a                            cold snare. Resection and retrieval were complete.                           A 4 mm polyp was found in the sigmoid colon. The                            polyp was sessile. The polyp was removed with a                            cold snare. Resection and retrieval were complete.                           Internal hemorrhoids were found during                            retroflexion. The hemorrhoids were small. Complications:            No immediate complications. Estimated Blood Loss:     Estimated blood loss was minimal. Impression:               - One 6 mm polyp in the cecum, removed with a cold                            snare. Resected and retrieved.                           - One 4 mm polyp in the ascending colon, removed                             with a cold snare. Resected and retrieved.                           - One 7 mm polyp in the descending colon, removed                            with a cold snare. Resected and retrieved.                           - One 4 mm polyp in the sigmoid colon, removed with                            a cold snare. Resected and retrieved.                           - Internal hemorrhoids. Recommendation:           - Patient has a contact number available for  emergencies. The signs and symptoms of potential                            delayed complications were discussed with the                            patient. Return to normal activities tomorrow.                            Written discharge instructions were provided to the                            patient.                           - Resume previous diet.                           - Continue present medications.                           - Await pathology results.                           - Repeat colonoscopy is recommended. The                            colonoscopy date will be determined after pathology                            results from today's exam become available for                            review. Jerene Bears, MD 08/01/2021 3:55:17 PM This report has been signed electronically.

## 2021-08-01 NOTE — Progress Notes (Signed)
Report to PACU, RN, vss, BBS= Clear.  

## 2021-08-01 NOTE — Patient Instructions (Addendum)
Read all of the handouts given to you by your recovery room nurse.  The office will call to schedule an upper GI series for you.  We will take you to the lab for bloodwork.  YOU HAD AN ENDOSCOPIC PROCEDURE TODAY AT [facility]:   Refer to the procedure report that was given to you for any specific questions about what was found during the examination.  If the procedure report does not answer your questions, please call your gastroenterologist to clarify.  If you requested that your care partner not be given the details of your procedure findings, then the procedure report has been included in a sealed envelope for you to review at your convenience later.  YOU SHOULD EXPECT: Some feelings of bloating in the abdomen. Passage of more gas than usual.  Walking can help get rid of the air that was put into your GI tract during the procedure and reduce the bloating. If you had a lower endoscopy (such as a colonoscopy or flexible sigmoidoscopy) you may notice spotting of blood in your stool or on the toilet paper. If you underwent a bowel prep for your procedure, you may not have a normal bowel movement for a few days.  Please Note:  You might notice some irritation and congestion in your nose or some drainage.  This is from the oxygen used during your procedure.  There is no need for concern and it should clear up in a day or so.  SYMPTOMS TO REPORT IMMEDIATELY:  Following lower endoscopy (colonoscopy or flexible sigmoidoscopy):  Excessive amounts of blood in the stool  Significant tenderness or worsening of abdominal pains  Swelling of the abdomen that is new, acute  Fever of 100F or higher  Following upper endoscopy (EGD)  Vomiting of blood or coffee ground material  New chest pain or pain under the shoulder blades  Painful or persistently difficult swallowing  New shortness of breath  Fever of 100F or higher  Black, tarry-looking stools  For urgent or emergent issues, a  gastroenterologist can be reached at any hour by calling [phone]. Do not use MyChart messaging for urgent concerns.    DIET:  We do recommend a small meal at first, but then you may proceed to your regular diet.  Drink plenty of fluids but you should avoid alcoholic beverages for 24 hours.  Try to eat a high fiber diet, and drink plenty of water.  Try to avoid acidic foods.  Read the orange handouts.  ACTIVITY:  You should plan to take it easy for the rest of today and you should NOT DRIVE or use heavy machinery until tomorrow (because of the sedation medicines used during the test).    FOLLOW UP: Our staff will call the number listed on your records 48-72 hours following your procedure to check on you and address any questions or concerns that you may have regarding the information given to you following your procedure. If we do not reach you, we will leave a message.  We will attempt to reach you two times.  During this call, we will ask if you have developed any symptoms of COVID 19. If you develop any symptoms (ie: fever, flu-like symptoms, shortness of breath, cough etc.) before then, please call [phone].  If you test positive for Covid 19 in the 2 weeks post procedure, please call and report this information to [place].    If any biopsies were taken you will be contacted by phone or  by letter within the next 1-3 weeks.  Please call us at [phone] if you have not heard about the biopsies in 3 weeks.    SIGNATURES/CONFIDENTIALITY: You and/or your care partner have signed paperwork which will be entered into your electronic medical record.  These signatures attest to the fact that that the information above on your After Visit Summary has been reviewed and is understood.  Full responsibility of the confidentiality of this discharge information lies with you and/or your care-partner.

## 2021-08-01 NOTE — Progress Notes (Signed)
D.T. vital signs. °

## 2021-08-01 NOTE — Op Note (Signed)
[place] Patient Name: Jenna Bentley Procedure Date: 08/01/2021 3:12 PM MRN: [id number] Endoscopist: Jerene Bears , MD Age: 71 Referring MD:  Date of Birth: [birth year] Gender: Female Account #: [id number] Procedure:                Upper GI endoscopy Indications:              Lower abdominal pain, Gastro-esophageal reflux                            disease, Abdominal bloating Medicines:                Monitored Anesthesia Care Procedure:                Pre-Anesthesia Assessment:                           - Prior to the procedure, a History and Physical                            was performed, and patient medications and                            allergies were reviewed. The patient's tolerance of                            previous anesthesia was also reviewed. The risks                            and benefits of the procedure and the sedation                            options and risks were discussed with the patient.                            All questions were answered, and informed consent                            was obtained. Prior Anticoagulants: The patient has                            taken no previous anticoagulant or antiplatelet                            agents. ASA Grade Assessment: II - A patient with                            mild systemic disease. After reviewing the risks                            and benefits, the patient was deemed in                            satisfactory condition to undergo the procedure.  After obtaining informed consent, the endoscope was                            passed under direct vision. Throughout the                            procedure, the patient's blood pressure, pulse, and                            oxygen saturations were monitored continuously. The                            GIF [id number] #[id number] was introduced through the                            mouth, and advanced to the second part  of duodenum.                            The upper GI endoscopy was accomplished without                            difficulty. The patient tolerated the procedure                            well. Scope In: Scope Out: Findings:                 A 5 cm hiatal hernia was found. The proximal extent                            of the gastric folds (end of tubular esophagus) was                            34 cm from the incisors. The hiatal narrowing was                            38 cm from the incisors.                           Mild inflammation characterized by erythema was                            found in the gastric antrum. Biopsies were taken                            with a cold forceps for histology and Helicobacter                            pylori testing.                           The examined duodenum was normal. Biopsies were                            taken  with a cold forceps for histology. Complications:            No immediate complications. Estimated Blood Loss:     Estimated blood loss was minimal. Impression:               - 5 cm hiatal hernia. Possible paraesophageal                            component.                           - Gastritis. Biopsied.                           - Normal examined duodenum. Biopsied. Recommendation:           - Patient has a contact number available for                            emergencies. The signs and symptoms of potential                            delayed complications were discussed with the                            patient. Return to normal activities tomorrow.                            Written discharge instructions were provided to the                            patient.                           - Resume previous diet.                           - Continue present medications.                           - Await pathology results.                           - UGI series to further examine for paraesophageal                             hernia. Jerene Bears, MD 08/01/2021 3:52:47 PM This report has been signed electronically.

## 2021-08-02 ENCOUNTER — Other Ambulatory Visit: Payer: Self-pay

## 2021-08-02 DIAGNOSIS — R1013 Epigastric pain: Secondary | ICD-10-CM

## 2021-08-05 ENCOUNTER — Telehealth: Payer: Self-pay

## 2021-08-05 NOTE — Telephone Encounter (Signed)
°  Follow up Call-  Call back number 08/01/2021  Post procedure Call Back phone  # [phone]  Permission to leave phone message Yes  Some recent data might be hidden     Patient questions:  Do you have a fever, pain , or abdominal swelling? No. Pain Score  0 *  Have you tolerated food without any problems? Yes.    Have you been able to return to your normal activities? Yes.    Do you have any questions about your discharge instructions: Diet   No. Medications  No. Follow up visit  No.  Do you have questions or concerns about your Care? No.  Actions: * If pain score is 4 or above: No action needed, pain <4.  Have you developed a fever since your procedure? no  2.   Have you had an respiratory symptoms (SOB or cough) since your procedure? no  3.   Have you tested positive for COVID 19 since your procedure no  4.   Have you had any family members/close contacts diagnosed with the COVID 19 since your procedure?  no   If yes to any of these questions please route to Joylene John, RN and Joella Prince, RN

## 2021-08-06 LAB — ALPHA-GAL PANEL
Beef IgE: 3.29 kU/L — ABNORMAL HIGH (ref <0.35)
Class: 2
Class: 2
Class: 2
Galactose-alpha-1,3-galactose IgE: 7.28 kU/L — ABNORMAL HIGH (ref <0.10)
LAMB/MUTTON IGE: 0.72 kU/L — ABNORMAL HIGH (ref <0.35)
Pork IgE: 1.08 kU/L — ABNORMAL HIGH (ref <0.35)

## 2021-08-08 ENCOUNTER — Encounter: Payer: Self-pay | Admitting: Internal Medicine

## 2021-08-09 ENCOUNTER — Telehealth: Payer: Self-pay | Admitting: Internal Medicine

## 2021-08-09 NOTE — Telephone Encounter (Signed)
Spoke with pt. Gave pt results and recommendations. Pt verbalized understanding. Also told pt I would mail her a copy of the letter. Letter placed in mail.

## 2021-08-09 NOTE — Telephone Encounter (Signed)
Left message for pt to call back  °

## 2021-08-09 NOTE — Telephone Encounter (Signed)
MyChart letter sent, please see those for details and you can inform the patient of the findings

## 2021-08-09 NOTE — Telephone Encounter (Signed)
Patient returned your call, please advise. 

## 2021-08-09 NOTE — Telephone Encounter (Signed)
Inbound call from patient requesting results from biopsy on 2/2. Please advise.

## 2021-08-14 ENCOUNTER — Other Ambulatory Visit: Payer: Self-pay

## 2021-08-14 ENCOUNTER — Ambulatory Visit (HOSPITAL_COMMUNITY)
Admission: RE | Admit: 2021-08-14 | Discharge: 2021-08-14 | Disposition: A | Discharge status: Home / Self Care | Payer: Medicare HMO | Source: Ambulatory Visit | Attending: Internal Medicine | Admitting: Internal Medicine

## 2021-08-14 DIAGNOSIS — R1013 Epigastric pain: Secondary | ICD-10-CM | POA: Insufficient documentation

## 2021-08-29 ENCOUNTER — Telehealth: Payer: Self-pay | Admitting: Internal Medicine

## 2021-08-29 NOTE — Telephone Encounter (Signed)
Referral was faxed on 08/14/21. Called [facility] and spoke with Raven. She has called pt and set up the appt and referral has been refaxed. Spoke with pt and she confirmed that she has not got an appt with Dr. Redmond Pulling. ?

## 2021-08-29 NOTE — Telephone Encounter (Signed)
Patient called inquiring about her referral to [facility].  She said she contacted them and they have not received one from our office yet.  She said there was a message left by Mickel Baas.  Can you please call and advise?  Thank you. ?

## 2021-09-19 ENCOUNTER — Other Ambulatory Visit: Payer: Self-pay

## 2021-09-19 ENCOUNTER — Telehealth: Payer: Self-pay

## 2021-09-19 DIAGNOSIS — K449 Diaphragmatic hernia without obstruction or gangrene: Secondary | ICD-10-CM

## 2021-09-19 NOTE — Telephone Encounter (Signed)
Referral for esophageal manometry received. ? ?Referring provider Dr Greer Pickerel of [facility] ? ?Patient contacted and instructed for procedure.  ?

## 2021-11-27 ENCOUNTER — Ambulatory Visit (HOSPITAL_COMMUNITY)
Admission: RE | Admit: 2021-11-27 | Discharge: 2021-11-27 | Disposition: A | Discharge status: Home / Self Care | Payer: Medicare HMO | Source: Ambulatory Visit | Attending: Gastroenterology | Admitting: Gastroenterology

## 2021-11-27 ENCOUNTER — Encounter (HOSPITAL_COMMUNITY): Payer: Self-pay | Admitting: Gastroenterology

## 2021-11-27 ENCOUNTER — Encounter (HOSPITAL_COMMUNITY)
Admission: RE | Disposition: A | Discharge status: Home / Self Care | Payer: Self-pay | Source: Ambulatory Visit | Attending: Gastroenterology

## 2021-11-27 DIAGNOSIS — R1310 Dysphagia, unspecified: Secondary | ICD-10-CM | POA: Insufficient documentation

## 2021-11-27 DIAGNOSIS — K219 Gastro-esophageal reflux disease without esophagitis: Secondary | ICD-10-CM

## 2021-11-27 DIAGNOSIS — K449 Diaphragmatic hernia without obstruction or gangrene: Secondary | ICD-10-CM | POA: Diagnosis not present

## 2021-11-27 HISTORY — PX: ESOPHAGEAL MANOMETRY: SHX5429

## 2021-11-27 SURGERY — MANOMETRY, ESOPHAGUS
Anesthesia: Choice

## 2021-11-27 MED ORDER — LIDOCAINE VISCOUS HCL 2 % MT SOLN
OROMUCOSAL | Status: AC
  Filled 2021-11-27: qty 15

## 2021-11-27 SURGICAL SUPPLY — 2 items
FACESHIELD LNG OPTICON STERILE (SAFETY) IMPLANT
GLOVE BIO SURGEON STRL SZ8 (GLOVE) ×6 IMPLANT

## 2021-11-27 NOTE — Progress Notes (Signed)
Esophageal Manometry done per protocol. Patient tolerated well without distress or complication.  

## 2021-12-10 DIAGNOSIS — K449 Diaphragmatic hernia without obstruction or gangrene: Secondary | ICD-10-CM

## 2021-12-10 DIAGNOSIS — R1310 Dysphagia, unspecified: Secondary | ICD-10-CM

## 2022-01-21 ENCOUNTER — Ambulatory Visit: Payer: Self-pay | Admitting: General Surgery

## 2022-01-21 DIAGNOSIS — K449 Diaphragmatic hernia without obstruction or gangrene: Secondary | ICD-10-CM

## 2022-01-21 NOTE — Progress Notes (Signed)
Sent message, via epic in basket, requesting order in epic from surgeon     01/21/22 1511  Preop Orders  Has preop orders? No  Name of staff/physician contacted for orders(Indicate phone or IB message) E. Redmond Pulling, MD.

## 2022-01-27 NOTE — Patient Instructions (Signed)
SURGICAL WAITING ROOM VISITATION Patients having surgery or a procedure may have no more than 2 support people in the waiting area - these visitors may rotate.   Children under the age of 70 must have an adult with them who is not the patient. If the patient needs to stay at the hospital during part of their recovery, the visitor guidelines for inpatient rooms apply. Pre-op nurse will coordinate an appropriate time for 1 support person to accompany patient in pre-op.  This support person may not rotate.    Please refer to the [facility] website for the visitor guidelines for Inpatients (after your surgery is over and you are in a regular room).    Your procedure is scheduled on: 02/07/22   Report to [facility] Main Entrance    Report to admitting at 7:15 AM   Call this number if you have problems the morning of surgery [phone]   Do not eat food :After Midnight.   After Midnight you may have the following liquids until 6:30 AM DAY OF SURGERY  Water Non-Citrus Juices (without pulp, NO RED) Carbonated Beverages Black Coffee (NO MILK/CREAM OR CREAMERS, sugar ok)  Clear Tea (NO MILK/CREAM OR CREAMERS, sugar ok) regular and decaf                             Plain Jell-O (NO RED)                                           Fruit ices (not with fruit pulp, NO RED)                                     Popsicles (NO RED)                                                               Sports drinks like Gatorade (NO RED)                 The day of surgery:  Drink ONE (1) Pre-Surgery Clear Ensure at 6:30 AM the morning of surgery. Drink in one sitting. Do not sip.  This drink was given to you during your hospital  pre-op appointment visit. Nothing else to drink after completing the  Pre-Surgery Clear Ensure.          If you have questions, please contact your surgeon's office.   FOLLOW BOWEL PREP AND ANY ADDITIONAL PRE OP INSTRUCTIONS YOU RECEIVED FROM YOUR SURGEON'S OFFICE!!!      Oral Hygiene is also important to reduce your risk of infection.                                    Remember - BRUSH YOUR TEETH THE MORNING OF SURGERY WITH YOUR REGULAR TOOTHPASTE   Take these medicines the morning of surgery with A SIP OF WATER: Amlodipine, Pantoprazole  You may not have any metal on your body including hair pins, jewelry, and body piercing             Do not wear make-up, lotions, powders, perfumes, or deodorant  Do not wear nail polish including gel and S&S, artificial/acrylic nails, or any other type of covering on natural nails including finger and toenails. If you have artificial nails, gel coating, etc. that needs to be removed by a nail salon please have this removed prior to surgery or surgery may need to be canceled/ delayed if the surgeon/ anesthesia feels like they are unable to be safely monitored.   Do not shave  48 hours prior to surgery.    Do not bring valuables to the hospital. [place].   Bring small overnight bag day of surgery.   DO NOT [place]. PHARMACY WILL DISPENSE MEDICATIONS LISTED ON YOUR MEDICATION LIST TO YOU DURING YOUR ADMISSION [place]!    Special Instructions: Bring a copy of your healthcare power of attorney and living will documents         the day of surgery if you haven't scanned them before.              Please read over the following fact sheets you were given: IF YOU HAVE QUESTIONS ABOUT YOUR PRE-OP INSTRUCTIONS PLEASE CALL [place] - Preparing for Surgery Before surgery, you can play an important role.  Because skin is not sterile, your skin needs to be as free of germs as possible.  You can reduce the number of germs on your skin by washing with CHG (chlorahexidine gluconate) soap before surgery.  CHG is an antiseptic cleaner which kills germs and bonds with the skin to continue  killing germs even after washing. Please DO NOT use if you have an allergy to CHG or antibacterial soaps.  If your skin becomes reddened/irritated stop using the CHG and inform your nurse when you arrive at Short Stay. Do not shave (including legs and underarms) for at least 48 hours prior to the first CHG shower.  You may shave your face/neck.  Please follow these instructions carefully:  1.  Shower with CHG Soap the night before surgery and the  morning of surgery.  2.  If you choose to wash your hair, wash your hair first as usual with your normal  shampoo.  3.  After you shampoo, rinse your hair and body thoroughly to remove the shampoo.                             4.  Use CHG as you would any other liquid soap.  You can apply chg directly to the skin and wash.  Gently with a scrungie or clean washcloth.  5.  Apply the CHG Soap to your body ONLY FROM THE NECK DOWN.   Do   not use on face/ open                           Wound or open sores. Avoid contact with eyes, ears mouth and   genitals (private parts).                       Wash  face,  Genitals (private parts) with your normal soap.             6.  Wash thoroughly, paying special attention to the area where your    surgery  will be performed.  7.  Thoroughly rinse your body with warm water from the neck down.  8.  DO NOT shower/wash with your normal soap after using and rinsing off the CHG Soap.                9.  Pat yourself dry with a clean towel.            10.  Wear clean pajamas.            11.  Place clean sheets on your bed the night of your first shower and do not  sleep with pets. Day of Surgery : Do not apply any lotions/deodorants the morning of surgery.  Please wear clean clothes to the hospital/surgery center.  FAILURE TO FOLLOW THESE INSTRUCTIONS MAY RESULT IN THE CANCELLATION OF YOUR SURGERY  PATIENT SIGNATURE_________________________________  NURSE  SIGNATURE__________________________________  ________________________________________________________________________  WHAT IS A BLOOD TRANSFUSION? Blood Transfusion Information  A transfusion is the replacement of blood or some of its parts. Blood is made up of multiple cells which provide different functions. Red blood cells carry oxygen and are used for blood loss replacement. White blood cells fight against infection. Platelets control bleeding. Plasma helps clot blood. Other blood products are available for specialized needs, such as hemophilia or other clotting disorders. BEFORE THE TRANSFUSION  Who gives blood for transfusions?  Healthy volunteers who are fully evaluated to make sure their blood is safe. This is blood bank blood. Transfusion therapy is the safest it has ever been in the practice of medicine. Before blood is taken from a donor, a complete history is taken to make sure that person has no history of diseases nor engages in risky social behavior (examples are intravenous drug use or sexual activity with multiple partners). The donor's travel history is screened to minimize risk of transmitting infections, such as malaria. The donated blood is tested for signs of infectious diseases, such as HIV and hepatitis. The blood is then tested to be sure it is compatible with you in order to minimize the chance of a transfusion reaction. If you or a relative donates blood, this is often done in anticipation of surgery and is not appropriate for emergency situations. It takes many days to process the donated blood. RISKS AND COMPLICATIONS Although transfusion therapy is very safe and saves many lives, the main dangers of transfusion include:  Getting an infectious disease. Developing a transfusion reaction. This is an allergic reaction to something in the blood you were given. Every precaution is taken to prevent this. The decision to have a blood transfusion has been considered carefully  by your caregiver before blood is given. Blood is not given unless the benefits outweigh the risks. AFTER THE TRANSFUSION Right after receiving a blood transfusion, you will usually feel much better and more energetic. This is especially true if your red blood cells have gotten low (anemic). The transfusion raises the level of the red blood cells which carry oxygen, and this usually causes an energy increase. The nurse administering the transfusion will monitor you carefully for complications. HOME CARE INSTRUCTIONS  No special instructions are needed after a transfusion. You may find your energy is better. Speak with your caregiver about any limitations on activity for underlying diseases you may  have. SEEK MEDICAL CARE IF:  Your condition is not improving after your transfusion. You develop redness or irritation at the intravenous (IV) site. SEEK IMMEDIATE MEDICAL CARE IF:  Any of the following symptoms occur over the next 12 hours: Shaking chills. You have a temperature by mouth above 102 F (38.9 C), not controlled by medicine. Chest, back, or muscle pain. People around you feel you are not acting correctly or are confused. Shortness of breath or difficulty breathing. Dizziness and fainting. You get a rash or develop hives. You have a decrease in urine output. Your urine turns a dark color or changes to pink, red, or brown. Any of the following symptoms occur over the next 10 days: You have a temperature by mouth above 102 F (38.9 C), not controlled by medicine. Shortness of breath. Weakness after normal activity. The white part of the eye turns yellow (jaundice). You have a decrease in the amount of urine or are urinating less often. Your urine turns a dark color or changes to pink, red, or brown. Document Released: 06/13/2000 Document Revised: 09/08/2011 Document Reviewed: 01/31/2008 [facility] Patient Information 2014 [place],  [place].  _______________________________________________________________________

## 2022-01-27 NOTE — Progress Notes (Signed)
COVID Vaccine Completed: yes x3  Date of COVID positive in last 90 days:  PCP - Bing Matter, PA Cardiologist -   Chest x-ray -  EKG -  Stress Test -  ECHO -  Cardiac Cath -  Pacemaker/ICD device last checked: Spinal Cord Stimulator:  Bowel Prep -   Sleep Study -  CPAP -   Fasting Blood Sugar -  Checks Blood Sugar _____ times a day  Blood Thinner Instructions: Aspirin Instructions: Last Dose:  Activity level:  Can go up a flight of stairs and perform activities of daily living without stopping and without symptoms of chest pain or shortness of breath.  Able to exercise without symptoms  Unable to go up a flight of stairs without symptoms of     Anesthesia review:   Patient denies shortness of breath, fever, cough and chest pain at PAT appointment  Patient verbalized understanding of instructions that were given to them at the PAT appointment. Patient was also instructed that they will need to review over the PAT instructions again at home before surgery.

## 2022-01-28 ENCOUNTER — Encounter (HOSPITAL_COMMUNITY): Payer: Self-pay

## 2022-01-28 ENCOUNTER — Encounter (HOSPITAL_COMMUNITY)
Admission: RE | Admit: 2022-01-28 | Discharge: 2022-01-28 | Disposition: A | Discharge status: Home / Self Care | Payer: Medicare HMO | Source: Ambulatory Visit | Attending: General Surgery | Admitting: General Surgery

## 2022-01-28 VITALS — BP 133/95 | HR 75 | Temp 97.5°F | Resp 12 | Ht 63.5 in | Wt 184.0 lb

## 2022-01-28 DIAGNOSIS — I10 Essential (primary) hypertension: Secondary | ICD-10-CM | POA: Diagnosis not present

## 2022-01-28 DIAGNOSIS — Z01818 Encounter for other preprocedural examination: Secondary | ICD-10-CM | POA: Diagnosis not present

## 2022-01-28 DIAGNOSIS — K449 Diaphragmatic hernia without obstruction or gangrene: Secondary | ICD-10-CM | POA: Insufficient documentation

## 2022-01-28 LAB — COMPREHENSIVE METABOLIC PANEL
ALT: 19 U/L (ref 0–44)
AST: 22 U/L (ref 15–41)
Albumin: 4.2 g/dL (ref 3.5–5.0)
Alkaline Phosphatase: 54 U/L (ref 38–126)
Anion gap: 10 (ref 5–15)
BUN: 15 mg/dL (ref 8–23)
CO2: 26 mmol/L (ref 22–32)
Calcium: 9.8 mg/dL (ref 8.9–10.3)
Chloride: 102 mmol/L (ref 98–111)
Creatinine, Ser: 1.04 mg/dL — ABNORMAL HIGH (ref 0.44–1.00)
GFR, Estimated: 58 mL/min — ABNORMAL LOW (ref >60)
Glucose, Bld: 85 mg/dL (ref 70–99)
Potassium: 3.7 mmol/L (ref 3.5–5.1)
Sodium: 138 mmol/L (ref 135–145)
Total Bilirubin: 0.6 mg/dL (ref 0.3–1.2)
Total Protein: 8.1 g/dL (ref 6.5–8.1)

## 2022-01-28 LAB — CBC WITH DIFFERENTIAL/PLATELET
Abs Immature Granulocytes: 0.02 10*3/uL (ref 0.00–0.07)
Basophils Absolute: 0.1 10*3/uL (ref 0.0–0.1)
Basophils Relative: 1 %
Eosinophils Absolute: 0 10*3/uL (ref 0.0–0.5)
Eosinophils Relative: 0 %
HCT: 38.8 % (ref 36.0–46.0)
Hemoglobin: 13.4 g/dL (ref 12.0–15.0)
Immature Granulocytes: 0 %
Lymphocytes Relative: 34 %
Lymphs Abs: 2.6 10*3/uL (ref 0.7–4.0)
MCH: 30.9 pg (ref 26.0–34.0)
MCHC: 34.5 g/dL (ref 30.0–36.0)
MCV: 89.4 fL (ref 80.0–100.0)
Monocytes Absolute: 0.6 10*3/uL (ref 0.1–1.0)
Monocytes Relative: 8 %
Neutro Abs: 4.2 10*3/uL (ref 1.7–7.7)
Neutrophils Relative %: 57 %
Platelets: 228 10*3/uL (ref 150–400)
RBC: 4.34 MIL/uL (ref 3.87–5.11)
RDW: 12.3 % (ref 11.5–15.5)
WBC: 7.4 10*3/uL (ref 4.0–10.5)
nRBC: 0 % (ref 0.0–0.2)

## 2022-02-07 ENCOUNTER — Ambulatory Visit (HOSPITAL_COMMUNITY): Payer: Medicare HMO | Admitting: Certified Registered Nurse Anesthetist

## 2022-02-07 ENCOUNTER — Encounter (HOSPITAL_COMMUNITY): Payer: Self-pay | Admitting: General Surgery

## 2022-02-07 ENCOUNTER — Ambulatory Visit (HOSPITAL_BASED_OUTPATIENT_CLINIC_OR_DEPARTMENT_OTHER): Payer: Medicare HMO | Admitting: Certified Registered Nurse Anesthetist

## 2022-02-07 ENCOUNTER — Encounter (HOSPITAL_COMMUNITY)
Admission: RE | Disposition: A | Discharge status: Home / Self Care | Payer: Self-pay | Source: Home / Self Care | Attending: General Surgery

## 2022-02-07 ENCOUNTER — Other Ambulatory Visit: Payer: Self-pay

## 2022-02-07 ENCOUNTER — Observation Stay (HOSPITAL_COMMUNITY)
Admission: RE | Admit: 2022-02-07 | Discharge: 2022-02-08 | Disposition: A | Discharge status: Home / Self Care | Payer: Medicare HMO | Attending: General Surgery | Admitting: General Surgery

## 2022-02-07 DIAGNOSIS — K449 Diaphragmatic hernia without obstruction or gangrene: Principal | ICD-10-CM | POA: Insufficient documentation

## 2022-02-07 DIAGNOSIS — Z96653 Presence of artificial knee joint, bilateral: Secondary | ICD-10-CM | POA: Insufficient documentation

## 2022-02-07 DIAGNOSIS — I10 Essential (primary) hypertension: Secondary | ICD-10-CM | POA: Diagnosis not present

## 2022-02-07 DIAGNOSIS — Z8719 Personal history of other diseases of the digestive system: Secondary | ICD-10-CM

## 2022-02-07 HISTORY — PX: XI ROBOTIC ASSISTED PARAESOPHAGEAL HERNIA REPAIR: SHX6871

## 2022-02-07 HISTORY — PX: UPPER GI ENDOSCOPY: SHX6162

## 2022-02-07 LAB — TYPE AND SCREEN
ABO/RH(D): O POS
Antibody Screen: NEGATIVE

## 2022-02-07 LAB — ABO/RH: ABO/RH(D): O POS

## 2022-02-07 SURGERY — REPAIR, HERNIA, PARAESOPHAGEAL, ROBOT-ASSISTED
Anesthesia: General

## 2022-02-07 MED ORDER — BUPIVACAINE LIPOSOME 1.3 % IJ SUSP: 20.0000 mL | Freq: Once | INTRAMUSCULAR | Status: DC

## 2022-02-07 MED ORDER — EPHEDRINE SULFATE-NACL 50-0.9 MG/10ML-% IV SOSY
PREFILLED_SYRINGE | INTRAVENOUS | Status: DC | PRN
  Administered 2022-02-07 (×2): 5 mg via INTRAVENOUS

## 2022-02-07 MED ORDER — PROPOFOL 10 MG/ML IV BOLUS
INTRAVENOUS | Status: DC | PRN
  Administered 2022-02-07: 100 mg via INTRAVENOUS

## 2022-02-07 MED ORDER — PHENOL 1.4 % MT LIQD
1.0000 | OROMUCOSAL | Status: DC | PRN
Start: 2022-02-07 — End: 2022-02-08
  Administered 2022-02-07: 1 via OROMUCOSAL
  Filled 2022-02-07: qty 177

## 2022-02-07 MED ORDER — SIMETHICONE 80 MG PO CHEW: 40.0000 mg | CHEWABLE_TABLET | Freq: Four times a day (QID) | ORAL | Status: DC | PRN

## 2022-02-07 MED ORDER — 0.9 % SODIUM CHLORIDE (POUR BTL) OPTIME
TOPICAL | Status: DC | PRN
  Administered 2022-02-07: 1000 mL

## 2022-02-07 MED ORDER — ORAL CARE MOUTH RINSE: 15.0000 mL | Freq: Once | OROMUCOSAL | Status: AC

## 2022-02-07 MED ORDER — CHLORHEXIDINE GLUCONATE 0.12 % MT SOLN
15.0000 mL | Freq: Once | OROMUCOSAL | Status: AC
  Administered 2022-02-07: 15 mL via OROMUCOSAL

## 2022-02-07 MED ORDER — PHENYLEPHRINE 80 MCG/ML (10ML) SYRINGE FOR IV PUSH (FOR BLOOD PRESSURE SUPPORT)
PREFILLED_SYRINGE | INTRAVENOUS | Status: AC
Start: 2022-02-07 — End: ?
  Filled 2022-02-07: qty 10

## 2022-02-07 MED ORDER — HYDROMORPHONE HCL 1 MG/ML IJ SOLN: 0.2500 mg | INTRAMUSCULAR | Status: DC | PRN

## 2022-02-07 MED ORDER — LACTATED RINGERS IV SOLN
INTRAVENOUS | Status: AC | PRN
  Administered 2022-02-07: 1000 mL

## 2022-02-07 MED ORDER — AMLODIPINE BESYLATE 5 MG PO TABS
2.5000 mg | ORAL_TABLET | Freq: Every day | ORAL | Status: DC
  Administered 2022-02-08: 2.5 mg via ORAL
  Filled 2022-02-07: qty 1

## 2022-02-07 MED ORDER — DEXAMETHASONE SODIUM PHOSPHATE 10 MG/ML IJ SOLN
INTRAMUSCULAR | Status: AC
  Filled 2022-02-07: qty 1

## 2022-02-07 MED ORDER — KETOROLAC TROMETHAMINE 15 MG/ML IJ SOLN
15.0000 mg | Freq: Three times a day (TID) | INTRAMUSCULAR | Status: DC | PRN
Start: 2022-02-07 — End: 2022-02-08
  Administered 2022-02-07: 15 mg via INTRAVENOUS
  Filled 2022-02-07: qty 1

## 2022-02-07 MED ORDER — LIDOCAINE HCL (PF) 2 % IJ SOLN
INTRAMUSCULAR | Status: AC
Start: 2022-02-07 — End: ?
  Filled 2022-02-07: qty 5

## 2022-02-07 MED ORDER — OXYCODONE HCL 5 MG PO TABS: 5.0000 mg | ORAL_TABLET | ORAL | Status: DC | PRN

## 2022-02-07 MED ORDER — ENSURE PRE-SURGERY PO LIQD
296.0000 mL | Freq: Once | ORAL | Status: DC
  Filled 2022-02-07: qty 296

## 2022-02-07 MED ORDER — FENTANYL CITRATE (PF) 250 MCG/5ML IJ SOLN
INTRAMUSCULAR | Status: DC | PRN
Start: 2022-02-07 — End: 2022-02-07
  Administered 2022-02-07 (×3): 50 ug via INTRAVENOUS
  Administered 2022-02-07: 100 ug via INTRAVENOUS

## 2022-02-07 MED ORDER — ACETAMINOPHEN 500 MG PO TABS
1000.0000 mg | ORAL_TABLET | ORAL | Status: AC
  Administered 2022-02-07: 1000 mg via ORAL
  Filled 2022-02-07: qty 2

## 2022-02-07 MED ORDER — ONDANSETRON HCL 4 MG/2ML IJ SOLN
INTRAMUSCULAR | Status: AC
  Filled 2022-02-07: qty 2

## 2022-02-07 MED ORDER — PANTOPRAZOLE SODIUM 40 MG IV SOLR
40.0000 mg | Freq: Every day | INTRAVENOUS | Status: DC
  Administered 2022-02-07: 40 mg via INTRAVENOUS
  Filled 2022-02-07: qty 10

## 2022-02-07 MED ORDER — BUPIVACAINE HCL (PF) 0.25 % IJ SOLN
INTRAMUSCULAR | Status: DC | PRN
  Administered 2022-02-07: 30 mL

## 2022-02-07 MED ORDER — FENTANYL CITRATE (PF) 250 MCG/5ML IJ SOLN
INTRAMUSCULAR | Status: AC
  Filled 2022-02-07: qty 5

## 2022-02-07 MED ORDER — BUPIVACAINE LIPOSOME 1.3 % IJ SUSP
INTRAMUSCULAR | Status: AC
  Filled 2022-02-07: qty 20

## 2022-02-07 MED ORDER — ACETAMINOPHEN 500 MG PO TABS
1000.0000 mg | ORAL_TABLET | Freq: Three times a day (TID) | ORAL | Status: DC
  Administered 2022-02-07 – 2022-02-08 (×2): 1000 mg via ORAL
  Filled 2022-02-07 (×2): qty 2

## 2022-02-07 MED ORDER — VALSARTAN-HYDROCHLOROTHIAZIDE 320-25 MG PO TABS: 1.0000 | ORAL_TABLET | Freq: Every day | ORAL | Status: DC

## 2022-02-07 MED ORDER — ROCURONIUM BROMIDE 10 MG/ML (PF) SYRINGE
PREFILLED_SYRINGE | INTRAVENOUS | Status: AC
  Filled 2022-02-07: qty 10

## 2022-02-07 MED ORDER — LIDOCAINE 2% (20 MG/ML) 5 ML SYRINGE
INTRAMUSCULAR | Status: DC | PRN
  Administered 2022-02-07: 60 mg via INTRAVENOUS
  Administered 2022-02-07: 1.5 mg/kg/h via INTRAVENOUS

## 2022-02-07 MED ORDER — CEFAZOLIN SODIUM-DEXTROSE 2-4 GM/100ML-% IV SOLN
2.0000 g | INTRAVENOUS | Status: AC
  Administered 2022-02-07: 2 g via INTRAVENOUS
  Filled 2022-02-07: qty 100

## 2022-02-07 MED ORDER — ONDANSETRON HCL 4 MG/2ML IJ SOLN
INTRAMUSCULAR | Status: DC | PRN
  Administered 2022-02-07: 4 mg via INTRAVENOUS

## 2022-02-07 MED ORDER — DEXAMETHASONE SODIUM PHOSPHATE 10 MG/ML IJ SOLN
INTRAMUSCULAR | Status: DC | PRN
  Administered 2022-02-07: 10 mg via INTRAVENOUS

## 2022-02-07 MED ORDER — CHLORHEXIDINE GLUCONATE CLOTH 2 % EX PADS: 6.0000 | MEDICATED_PAD | Freq: Once | CUTANEOUS | Status: DC

## 2022-02-07 MED ORDER — BUPIVACAINE LIPOSOME 1.3 % IJ SUSP
INTRAMUSCULAR | Status: DC | PRN
  Administered 2022-02-07: 20 mL

## 2022-02-07 MED ORDER — PHENYLEPHRINE HCL-NACL 20-0.9 MG/250ML-% IV SOLN
INTRAVENOUS | Status: DC | PRN
  Administered 2022-02-07 (×2): 80 ug via INTRAVENOUS
  Administered 2022-02-07: 25 ug/min via INTRAVENOUS
  Administered 2022-02-07: 160 ug via INTRAVENOUS

## 2022-02-07 MED ORDER — MIDAZOLAM HCL 2 MG/2ML IJ SOLN
INTRAMUSCULAR | Status: AC
  Filled 2022-02-07: qty 2

## 2022-02-07 MED ORDER — ONDANSETRON HCL 4 MG/2ML IJ SOLN: 4.0000 mg | Freq: Four times a day (QID) | INTRAMUSCULAR | Status: DC | PRN

## 2022-02-07 MED ORDER — KCL IN DEXTROSE-NACL 20-5-0.45 MEQ/L-%-% IV SOLN
INTRAVENOUS | Status: DC
  Filled 2022-02-07 (×2): qty 1000

## 2022-02-07 MED ORDER — DIPHENHYDRAMINE HCL 50 MG/ML IJ SOLN: 12.5000 mg | Freq: Four times a day (QID) | INTRAMUSCULAR | Status: DC | PRN

## 2022-02-07 MED ORDER — DIPHENHYDRAMINE HCL 12.5 MG/5ML PO ELIX
12.5000 mg | ORAL_SOLUTION | Freq: Four times a day (QID) | ORAL | Status: DC | PRN
  Administered 2022-02-07: 12.5 mg via ORAL
  Filled 2022-02-07: qty 5

## 2022-02-07 MED ORDER — KETAMINE HCL 10 MG/ML IJ SOLN
INTRAMUSCULAR | Status: DC | PRN
  Administered 2022-02-07 (×2): 10 mg via INTRAVENOUS
  Administered 2022-02-07: 30 mg via INTRAVENOUS

## 2022-02-07 MED ORDER — DEXAMETHASONE SODIUM PHOSPHATE 4 MG/ML IJ SOLN: 4.0000 mg | INTRAMUSCULAR | Status: DC

## 2022-02-07 MED ORDER — HYDROCHLOROTHIAZIDE 25 MG PO TABS
25.0000 mg | ORAL_TABLET | Freq: Every day | ORAL | Status: DC
  Administered 2022-02-07 – 2022-02-08 (×2): 25 mg via ORAL
  Filled 2022-02-07 (×2): qty 1

## 2022-02-07 MED ORDER — PROCHLORPERAZINE EDISYLATE 10 MG/2ML IJ SOLN: 10.0000 mg | Freq: Four times a day (QID) | INTRAMUSCULAR | Status: DC | PRN

## 2022-02-07 MED ORDER — IRBESARTAN 150 MG PO TABS
300.0000 mg | ORAL_TABLET | Freq: Every day | ORAL | Status: DC
  Administered 2022-02-07 – 2022-02-08 (×2): 300 mg via ORAL
  Filled 2022-02-07 (×2): qty 2

## 2022-02-07 MED ORDER — SCOPOLAMINE 1 MG/3DAYS TD PT72
1.0000 | MEDICATED_PATCH | TRANSDERMAL | Status: DC
  Administered 2022-02-07: 1.5 mg via TRANSDERMAL
  Filled 2022-02-07: qty 1

## 2022-02-07 MED ORDER — ONDANSETRON 4 MG PO TBDP: 4.0000 mg | ORAL_TABLET | Freq: Four times a day (QID) | ORAL | Status: DC | PRN

## 2022-02-07 MED ORDER — SUGAMMADEX SODIUM 200 MG/2ML IV SOLN
INTRAVENOUS | Status: DC | PRN
  Administered 2022-02-07: 200 mg via INTRAVENOUS

## 2022-02-07 MED ORDER — LACTATED RINGERS IV SOLN: INTRAVENOUS | Status: DC

## 2022-02-07 MED ORDER — FENTANYL CITRATE PF 50 MCG/ML IJ SOSY: 25.0000 ug | PREFILLED_SYRINGE | INTRAMUSCULAR | Status: DC | PRN

## 2022-02-07 MED ORDER — KETAMINE HCL 50 MG/5ML IJ SOSY
PREFILLED_SYRINGE | INTRAMUSCULAR | Status: AC
  Filled 2022-02-07: qty 5

## 2022-02-07 MED ORDER — HEPARIN SODIUM (PORCINE) 5000 UNIT/ML IJ SOLN
5000.0000 [IU] | Freq: Three times a day (TID) | INTRAMUSCULAR | Status: DC
  Administered 2022-02-07 – 2022-02-08 (×2): 5000 [IU] via SUBCUTANEOUS
  Filled 2022-02-07 (×2): qty 1

## 2022-02-07 MED ORDER — ROCURONIUM BROMIDE 10 MG/ML (PF) SYRINGE
PREFILLED_SYRINGE | INTRAVENOUS | Status: DC | PRN
  Administered 2022-02-07 (×2): 20 mg via INTRAVENOUS
  Administered 2022-02-07: 60 mg via INTRAVENOUS

## 2022-02-07 MED ORDER — MIDAZOLAM HCL 5 MG/5ML IJ SOLN
INTRAMUSCULAR | Status: DC | PRN
  Administered 2022-02-07: 2 mg via INTRAVENOUS

## 2022-02-07 MED ORDER — PROPOFOL 10 MG/ML IV BOLUS
INTRAVENOUS | Status: AC
Start: 2022-02-07 — End: ?
  Filled 2022-02-07: qty 20

## 2022-02-07 MED ORDER — LACTATED RINGERS IV SOLN: INTRAVENOUS | Status: DC | PRN

## 2022-02-07 SURGICAL SUPPLY — 77 items
ADH SKN CLS APL DERMABOND .7 (GAUZE/BANDAGES/DRESSINGS)
APL PRP STRL LF DISP 70% ISPRP (MISCELLANEOUS) ×1
APPLIER CLIP 5 13 M/L LIGAMAX5 (MISCELLANEOUS)
APPLIER CLIP ROT 10 11.4 M/L (STAPLE)
APR CLP MED LRG 11.4X10 (STAPLE)
APR CLP MED LRG 5 ANG JAW (MISCELLANEOUS)
BAG SPEC RTRVL 10 TROC 200 (ENDOMECHANICALS) ×1
BLADE SURG SZ11 CARB STEEL (BLADE) ×3 IMPLANT
CHLORAPREP W/TINT 26 (MISCELLANEOUS) ×3 IMPLANT
CLIP APPLIE 5 13 M/L LIGAMAX5 (MISCELLANEOUS) IMPLANT
CLIP APPLIE ROT 10 11.4 M/L (STAPLE) IMPLANT
CLSR STERI-STRIP ANTIMIC 1/2X4 (GAUZE/BANDAGES/DRESSINGS) ×3 IMPLANT
COVER SURGICAL LIGHT HANDLE (MISCELLANEOUS) ×3 IMPLANT
COVER TIP SHEARS 8 DVNC (MISCELLANEOUS) IMPLANT
COVER TIP SHEARS 8MM DA VINCI (MISCELLANEOUS)
DERMABOND ADVANCED (GAUZE/BANDAGES/DRESSINGS)
DERMABOND ADVANCED .7 DNX12 (GAUZE/BANDAGES/DRESSINGS) IMPLANT
DRAIN PENROSE 0.5X18 (DRAIN) IMPLANT
DRAPE ARM DVNC X/XI (DISPOSABLE) ×8 IMPLANT
DRAPE COLUMN DVNC XI (DISPOSABLE) ×2 IMPLANT
DRAPE DA VINCI XI ARM (DISPOSABLE) ×8
DRAPE DA VINCI XI COLUMN (DISPOSABLE) ×2
DRSG TEGADERM 2-3/8X2-3/4 SM (GAUZE/BANDAGES/DRESSINGS) ×18 IMPLANT
DRSG TEGADERM 4X4.75 (GAUZE/BANDAGES/DRESSINGS) ×1 IMPLANT
ELECT PENCIL ROCKER SW 15FT (MISCELLANEOUS) ×1 IMPLANT
ELECT REM PT RETURN 15FT ADLT (MISCELLANEOUS) ×3 IMPLANT
GAUZE 4X4 16PLY ~~LOC~~+RFID DBL (SPONGE) ×3 IMPLANT
GAUZE SPONGE 2X2 8PLY STRL LF (GAUZE/BANDAGES/DRESSINGS) ×2 IMPLANT
GLOVE BIO SURGEON STRL SZ7.5 (GLOVE) ×3 IMPLANT
GLOVE INDICATOR 8.0 STRL GRN (GLOVE) ×3 IMPLANT
GLOVE SURG LX 8.0 MICRO (GLOVE) ×3
GLOVE SURG LX STRL 8.0 MICRO (GLOVE) ×6 IMPLANT
GOWN STRL REUS W/ TWL XL LVL3 (GOWN DISPOSABLE) IMPLANT
GOWN STRL REUS W/TWL XL LVL3 (GOWN DISPOSABLE)
GRASPER SUT TROCAR 14GX15 (MISCELLANEOUS) ×1 IMPLANT
IRRIG SUCT STRYKERFLOW 2 WTIP (MISCELLANEOUS) ×2
IRRIGATION SUCT STRKRFLW 2 WTP (MISCELLANEOUS) ×2 IMPLANT
KIT BASIN OR (CUSTOM PROCEDURE TRAY) ×3 IMPLANT
KIT GASTRIC LAVAGE 34FR ADT (SET/KITS/TRAYS/PACK) ×1 IMPLANT
KIT TURNOVER KIT A (KITS) IMPLANT
LUBRICANT JELLY K Y 4OZ (MISCELLANEOUS) IMPLANT
MARKER SKIN DUAL TIP RULER LAB (MISCELLANEOUS) IMPLANT
NEEDLE HYPO 22GX1.5 SAFETY (NEEDLE) ×3 IMPLANT
PACK CARDIOVASCULAR III (CUSTOM PROCEDURE TRAY) ×3 IMPLANT
PAD POSITIONING PINK XL (MISCELLANEOUS) ×3 IMPLANT
POUCH RETRIEVAL ECOSAC 10 (ENDOMECHANICALS) IMPLANT
POUCH RETRIEVAL ECOSAC 10MM (ENDOMECHANICALS) ×2
SCISSORS LAP 5X35 DISP (ENDOMECHANICALS) IMPLANT
SEAL CANN UNIV 5-8 DVNC XI (MISCELLANEOUS) ×8 IMPLANT
SEAL XI 5MM-8MM UNIVERSAL (MISCELLANEOUS) ×8
SEALER VESSEL DA VINCI XI (MISCELLANEOUS) ×2
SEALER VESSEL EXT DVNC XI (MISCELLANEOUS) ×2 IMPLANT
SOL ANTI FOG 6CC (MISCELLANEOUS) ×2 IMPLANT
SOLUTION ANTI FOG 6CC (MISCELLANEOUS) ×1
SOLUTION ELECTROLUBE (MISCELLANEOUS) ×3 IMPLANT
SPIKE FLUID TRANSFER (MISCELLANEOUS) ×3 IMPLANT
SPONGE GAUZE 2X2 STER 10/PKG (GAUZE/BANDAGES/DRESSINGS) ×1
SUT ETHIBOND 0 36 GRN (SUTURE) ×9 IMPLANT
SUT ETHILON 2 0 PS N (SUTURE) ×1 IMPLANT
SUT MNCRL AB 4-0 PS2 18 (SUTURE) ×3 IMPLANT
SUT SILK 0 SH 30 (SUTURE) IMPLANT
SUT SILK 2 0 SH (SUTURE) IMPLANT
SUT VIC AB 0 UR5 27 (SUTURE) IMPLANT
SUT VICRYL 0 UR6 27IN ABS (SUTURE) ×1 IMPLANT
SYR 10ML ECCENTRIC (SYRINGE) ×3 IMPLANT
SYR 20ML LL LF (SYRINGE) ×3 IMPLANT
TAPE STRIPS DRAPE STRL (GAUZE/BANDAGES/DRESSINGS) ×1 IMPLANT
TIP INNERVISION DETACH 40FR (MISCELLANEOUS) IMPLANT
TIP INNERVISION DETACH 50FR (MISCELLANEOUS) IMPLANT
TIP INNERVISION DETACH 56FR (MISCELLANEOUS) IMPLANT
TIPS INNERVISION DETACH 40FR (MISCELLANEOUS)
TOWEL OR 17X26 10 PK STRL BLUE (TOWEL DISPOSABLE) ×3 IMPLANT
TRAY FOLEY MTR SLVR 16FR STAT (SET/KITS/TRAYS/PACK) IMPLANT
TROCAR ADV FIXATION 12X100MM (TROCAR) IMPLANT
TROCAR ADV FIXATION 5X100MM (TROCAR) IMPLANT
TROCAR Z-THREAD OPTICAL 5X100M (TROCAR) ×3 IMPLANT
TUBING INSUFFLATION 10FT LAP (TUBING) ×3 IMPLANT

## 2022-02-07 NOTE — Anesthesia Preprocedure Evaluation (Addendum)
Anesthesia Evaluation  Patient identified by MRN, date of birth, ID band Patient awake    Reviewed: Allergy & Precautions, H&P , NPO status , Patient's Chart, lab work & pertinent test results  Airway Mallampati: III  TM Distance: >3 FB Neck ROM: Full    Dental no notable dental hx. (+) Teeth Intact, Dental Advisory Given   Pulmonary neg pulmonary ROS,    Pulmonary exam normal breath sounds clear to auscultation       Cardiovascular hypertension, Pt. on medications  Rhythm:Regular Rate:Normal     Neuro/Psych Anxiety negative neurological ROS     GI/Hepatic Neg liver ROS, hiatal hernia, GERD  Medicated,  Endo/Other  negative endocrine ROS  Renal/GU negative Renal ROS  negative genitourinary   Musculoskeletal   Abdominal   Peds  Hematology negative hematology ROS (+)   Anesthesia Other Findings   Reproductive/Obstetrics negative OB ROS                            Anesthesia Physical Anesthesia Plan  ASA: 2  Anesthesia Plan: General   Post-op Pain Management: Tylenol PO (pre-op)*   Induction: Intravenous  PONV Risk Score and Plan: 4 or greater and Ondansetron, Dexamethasone and Midazolam  Airway Management Planned: Oral ETT  Additional Equipment:   Intra-op Plan:   Post-operative Plan: Extubation in OR  Informed Consent: I have reviewed the patients History and Physical, chart, labs and discussed the procedure including the risks, benefits and alternatives for the proposed anesthesia with the patient or authorized representative who has indicated his/her understanding and acceptance.     Dental advisory given  Plan Discussed with: CRNA  Anesthesia Plan Comments:         Anesthesia Quick Evaluation

## 2022-02-07 NOTE — H&P (Signed)
CC: here for surgery  Requesting provider: na  HPI: Jenna Bentley is an 71 y.o. female who is here for robotic repair of paraesophageal hernia with fundoplication and possible mesh.  She denies any changes since I saw her in the clinic a few months ago.  She underwent esophageal manometry after our visit which revealed normal manometry  Old HPI: She is accompanied by her husband. 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 She states all of the work-up started when she started experiencing lower abdominal pain last fall after eating a salad. 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 So she modified her diet and eliminated those foods from her daily consumption. She then started noticing that she would have discomfort while eating. She would get full quickly. She states her heartburn has gotten worse. If she eats a whole solid she will have heartburn and start coughing. She is taking omeprazole daily and Mylanta 4-6 times a day. She has learned that if she eats too much at 1 sitting she will definitely have symptoms of epigastric pressure and discomfort. She states that she will get severe heartburn just after eating a small bowl of salad. She uses a wedge pillow to sleep on. Rare regurgitation. No pain with swallowing liquids or solids. She states that interestingly and she feels like her heartburn has gotten worse after her upper endoscopy. She states that for what ever reason she can tolerate breads easily. No prior abdominal surgery.  No chest pain, chest pressure, dyspnea exertion, TIAs or amaurosis fugax.  Past Medical History:  Diagnosis Date   GERD (gastroesophageal reflux disease)    HLD (hyperlipidemia)    HTN (hypertension)    S/P rotator cuff repair     Past Surgical History:  Procedure Laterality Date   BLADDER SUSPENSION     ESOPHAGEAL MANOMETRY N/A 11/27/2021   Procedure: ESOPHAGEAL MANOMETRY (EM);  Surgeon: Mauri Pole, MD;   Location: [facility];  Service: Gastroenterology;  Laterality: N/A;   KNEE ARTHROSCOPY W/ MENISCAL REPAIR Left    ROTATOR CUFF REPAIR Right 2015   TOTAL KNEE ARTHROPLASTY Bilateral 2021   WISDOM TOOTH EXTRACTION      Family History  Problem Relation Age of Onset   Breast cancer Mother    Diabetes Mother    Heart disease Father    Breast cancer Sister    Heart disease Brother    Stroke Brother    Hypertension Daughter    Heart disease Daughter     Social:  reports that she has never smoked. She has never used smokeless tobacco. She reports current alcohol use. She reports that she does not currently use drugs.  Allergies:  Allergies  Allergen Reactions   Alpha-Gal     Beef and Pork   Lower Abdomen pain     Morphine Other (See Comments)    Passed out    Medications: I have reviewed the patient's current medications.   ROS - all of the below systems have been reviewed with the patient and positives are indicated with bold text General: chills, fever or night sweats Eyes: blurry vision or double vision ENT: epistaxis or sore throat Allergy/Immunology: itchy/watery eyes or nasal congestion Hematologic/Lymphatic: bleeding problems, blood clots or swollen lymph nodes Endocrine: temperature intolerance or unexpected weight changes Breast: new or changing breast lumps or nipple discharge Resp: cough, shortness of breath, or wheezing CV: chest pain or dyspnea on exertion GI: as per HPI GU:  dysuria, trouble voiding, or hematuria MSK: joint pain or joint stiffness Neuro: TIA or stroke symptoms Derm: pruritus and skin lesion changes Psych: anxiety and depression  PE Blood pressure 120/79, pulse 73, temperature 99 F (37.2 C), temperature source Oral, resp. rate 12, height 5' 3.5" (1.613 m), weight 83.5 kg, SpO2 98 %. Constitutional: NAD; conversant; no deformities Eyes: Moist conjunctiva; no lid lag; anicteric; PERRL Neck: Trachea midline; no thyromegaly Lungs: Normal  respiratory effort; no tactile fremitus CV: RRR; no palpable thrills; no pitting edema GI: Abd soft, nt, nd; no palpable hepatosplenomegaly MSK: Normal gait; no clubbing/cyanosis Psychiatric: Appropriate affect; alert and oriented x3 Lymphatic: No palpable cervical or axillary lymphadenopathy Skin:no rash/lesion  Results for orders placed or performed during the hospital encounter of 02/07/22 (from the past 48 hour(s))  ABO/Rh     Status: None (Preliminary result)   Collection Time: 02/07/22  8:00 AM  Result Value Ref Range   ABO/RH(D) PENDING     No results found.  Imaging: Upper GI from February 2023 showed a type II paraesophageal hernia containing about 30% the stomach, distal esophageal mucosa ring causing focal narrowing of the distal esophagus down to about 1.3 cm in diameter although a 13 mm barium tablet did pass  egd 2/23 - A 5 cm hiatal hernia was found. The proximal extent of the gastric folds (end of tubular esophagus) was 34 cm from the incisors. The hiatal narrowing was 38 cm from the incisors. Findings: - Mild inflammation characterized by erythema was found in the gastric antrum. Biopsies were taken with a cold forceps for histology and Helicobacter pylori testing. - The examined duodenum was normal. Biopsies were taken with a cold forceps for histology.  A/P: Jenna Bentley is an 71 y.o. female with  Paraesophageal hernia  Gastroesophageal reflux disease, unspecified whether esophagitis present  2 OR for robotic repair paraesophageal hernia with partial fundoplication with possible mesh  Enhanced recovery protocol, preoperative subcutaneous heparin, IV antibiotic  We discussed typical hospitalization and typical recovery.  Discussed diet transition with patient.  Discussed that she would be rounded on by my partners this weekend  Leighton Ruff. Redmond Pulling, MD, FACS General, Bariatric, & Minimally Invasive Surgery [facility], [place]

## 2022-02-07 NOTE — Discharge Instructions (Signed)
EATING AFTER YOUR ESOPHAGEAL SURGERY (Stomach Fundoplication, Hiatal Hernia repair, Achalasia surgery, etc)  ######################################################################  EAT Start with a full liquid diet (see below) Gradually transition to a high fiber diet with a fiber supplement over the next month after discharge.    WALK Walk an hour a day.  Control your pain to do that.    CONTROL PAIN Control pain so that you can walk, sleep, tolerate sneezing/coughing, go up/down stairs.  HAVE A BOWEL MOVEMENT DAILY Keep your bowels regular to avoid problems.  OK to try a laxative to override constipation.  OK to use an antidairrheal to slow down diarrhea.  Call if not better after 2 tries  CALL IF YOU HAVE PROBLEMS/CONCERNS Call if you are still struggling despite following these instructions. Call if you have concerns not answered by these instructions  ######################################################################   After your esophageal surgery, expect some sticking with swallowing over the next 1-2 months.    If food sticks when you eat, it is called "dysphagia".  This is due to swelling around your esophagus at the wrap & hiatal diaphragm repair.  It will gradually ease off over the next few months.  To help you through this temporary phase, we start you out on a full liquid diet.  Your first meal in the hospital was thin liquids.  You should have been given a full liquid diet by the time you left the hospital. Stay on clears and full liquids for the first week. Some patients may need to stay on a liquid diet for up to 2 weeks if having trouble swallowing.  Once tolerating that well, you can advance to pureed diet.   We ask patients to stay on a pureed diet for the 2nd-3rd week to avoid anything getting "stuck" near your recent surgery.  Don't be alarmed if your ability to swallow doesn't progress according to this plan.  Everyone is different and some diets can advance  more or less quickly.    It is often helpful to crush your medications or split them as they can sometimes stick, especially the first week or so.   Some BASIC RULES to follow are: Maintain an upright position whenever eating or drinking. Take small bites - just a teaspoon size bite at a time. Eat slowly.  It may also help to eat only one food at a time. Consider nibbling through smaller, more frequent meals & avoid the urge to eat BIG meals Do not push through feelings of fullness, nausea, or bloatedness Do not mix solid foods and liquids in the same mouthful Try not to "wash foods down" with large gulps of liquids. Avoid carbonated (bubbly/fizzy) drinks.   Avoid foods that make you feel gassy or bloated.  Start with bland foods first.  Wait on trying greasy, fried, or spicy meals until you are tolerating more bland solids well. Understand that it will be hard to burp and belch at first.  This gradually improves with time.  Expect to be more gassy/flatulent/bloated initially.  Walking will help your body manage it better. Consider using medications for bloating that contain simethicone such as  Maalox or Gas-X  Consider crushing her medications, especially smaller pills.  The ability to swallow pills should get easier after a few weeks Eat in a relaxed atmosphere & minimize distractions. Avoid talking while eating.   Do not use straws. Following each meal, sit in an upright position (90 degree angle) for 60 to 90 minutes.  Going for a short walk can  help as well If food does stick, don't panic.  Try to relax and let the food pass on its own.  Sipping WARM LIQUID such as strong hot black tea can also help slide it down.   Be gradual in changes & use common sense:  -If you easily tolerating a certain "level" of foods, advance to the next level gradually -If you are having trouble swallowing a particular food, then avoid it.   -If food is sticking when you advance your diet, go back to  thinner previous diet (the lower LEVEL) for 1-2 days.  LEVEL 2 = PUREED DIET  Start 1- 2 WEEKS AFTER SURGERY IF YOU ARE TOLERATING A FULL LIQUID DIET EASILY  -Foods in this group are pureed or blenderized to a smooth, mashed potato-like consistency.  -If necessary, the pureed foods can keep their shape with the addition of a thickening agent.   -Meat should be pureed to a smooth, pasty consistency.  Hot broth or gravy may be added to the pureed meat, approximately 1 oz. of liquid per 3 oz. serving of meat. -CAUTION:  If any foods do not puree into a smooth consistency, swallowing will be more difficult.  (For example, nuts or seeds sometimes do not blend well.)  Hot Foods Cold Foods  Pureed scrambled eggs and cheese Pureed cottage cheese  Baby cereals Thickened juices and nectars  Thinned cooked cereals (no lumps) Thickened milk or eggnog  Pureed [place] toast or pancakes Ensure  Mashed potatoes Ice cream  Pureed parsley, au gratin, scalloped potatoes, candied sweet potatoes Fruit or [place] ice, sherbet  Pureed buttered or alfredo noodles Plain yogurt  Pureed vegetables (no corn or peas) Instant breakfast  Pureed soups and creamed soups Smooth pudding, mousse, custard  Pureed scalloped apples Whipped gelatin  Gravies Sugar, syrup, honey, jelly  Sauces, cheese, tomato, barbecue, white, creamed Cream  Any baby food Creamer  Alcohol in moderation (not beer or champagne) Margarine  Coffee or tea Mayonnaise   Ketchup, mustard   Apple sauce   SAMPLE MENU:  PUREED DIET Breakfast Lunch Dinner  Orange juice, 1/2 cup Cream of wheat, 1/2 cup Pineapple juice, 1/2 cup Pureed [place], barley soup, 3/4 cup Pureed Hawaiian chicken, 3 oz  Scrambled eggs, mashed or blended with cheese, 1/2 cup Tea or coffee, 1 cup  Whole milk, 1 cup  Non-dairy creamer, 2 Tbsp. Mashed potatoes, 1/2 cup Pureed cooled broccoli, 1/2 cup Apple sauce, 1/2 cup Coffee or tea Mashed potatoes, 1/2 cup Pureed spinach,  1/2 cup Frozen yogurt, 1/2 cup Tea or coffee      LEVEL 3 = SOFT DIET  After your first 4 weeks, you can advance to a soft diet.   Keep on this diet until everything goes down easily.  Hot Foods Cold Foods  White fish Cottage cheese  Stuffed fish Junior baby fruit  Baby food meals Semi thickened juices  Minced soft cooked, scrambled, poached eggs nectars  Souffle & omelets Ripe mashed bananas  Cooked cereals Canned fruit, pineapple sauce, milk  potatoes Milkshake  Buttered or Alfredo noodles Custard  Cooked cooled vegetable Puddings, including tapioca  Sherbet Yogurt  Vegetable soup or alphabet soup Fruit ice, [place] ice  Gravies Whipped gelatin  Sugar, syrup, honey, jelly Junior baby desserts  Sauces:  Cheese, creamed, barbecue, tomato, white Cream  Coffee or tea Margarine   SAMPLE MENU:  LEVEL 3 Breakfast Lunch Dinner  Orange juice, 1/2 cup Oatmeal, 1/2 cup Scrambled eggs with cheese, 1/2 cup Decaffeinated tea,  1 cup Whole milk, 1 cup Non-dairy creamer, 2 Tbsp Pineapple juice, 1/2 cup Minced beef, 3 oz Gravy, 2 Tbsp Mashed potatoes, 1/2 cup Minced fresh broccoli, 1/2 cup Applesauce, 1/2 cup Coffee, 1 cup [place], barley soup, 3/4 cup Minced Hawaiian chicken, 3 oz Mashed potatoes, 1/2 cup Cooked spinach, 1/2 cup Frozen yogurt, 1/2 cup Non-dairy creamer, 2 Tbsp      LEVEL 4 = CHOPPED DIET  -After all the foods in level 3 (soft diet) are passing through well you should advance up to more chopped foods.  -It is still important to cut these foods into small pieces and eat slowly.  Hot Foods Cold Foods  Poultry Cottage cheese  Chopped Swedish meatballs Yogurt  Meat salads (ground or flaked meat) Milk  Flaked fish (tuna) Milkshakes  Poached or scrambled eggs Soft, cold, dry cereal  Souffles and omelets Fruit juices or nectars  Cooked cereals Chopped canned fruit  Chopped [place] toast or pancakes Canned fruit cocktail  Noodles or pasta (no rice) Pudding,  mousse, custard  Cooked vegetables (no frozen peas, corn, or mixed vegetables) Green salad  Canned small sweet peas Ice cream  Creamed soup or vegetable soup Fruit ice, [place] ice  Pureed vegetable soup or alphabet soup Non-dairy creamer  Ground scalloped apples Margarine  Gravies Mayonnaise  Sauces:  Cheese, creamed, barbecue, tomato, white Ketchup  Coffee or tea Mustard   SAMPLE MENU:  LEVEL 4 Breakfast Lunch Dinner  Orange juice, 1/2 cup Oatmeal, 1/2 cup Scrambled eggs with cheese, 1/2 cup Decaffeinated tea, 1 cup Whole milk, 1 cup Non-dairy creamer, 2 Tbsp Ketchup, 1 Tbsp Margarine, 1 tsp Salt, 1/4 tsp Sugar, 2 tsp Pineapple juice, 1/2 cup Ground beef, 3 oz Gravy, 2 Tbsp Mashed potatoes, 1/2 cup Cooked spinach, 1/2 cup Applesauce, 1/2 cup Decaffeinated coffee Whole milk Non-dairy creamer, 2 Tbsp Margarine, 1 tsp Salt, 1/4 tsp Pureed [place], barley soup, 3/4 cup Barbecue chicken, 3 oz Mashed potatoes, 1/2 cup Ground fresh broccoli, 1/2 cup Frozen yogurt, 1/2 cup Decaffeinated tea, 1 cup Non-dairy creamer, 2 Tbsp Margarine, 1 tsp Salt, 1/4 tsp Sugar, 1 tsp    LEVEL 5:  REGULAR FOODS  -Foods in this group are soft, moist, regularly textured foods.   -This level includes meat and breads, which tend to be the hardest things to swallow.   -Eat very slowly, chew well and continue to avoid carbonated drinks. -most people are at this level in 6 weeks  Hot Foods Cold Foods  Baked fish or skinned Soft cheeses - cottage cheese  Souffles and omelets Cream cheese  Eggs Yogurt  Stuffed shells Milk  Spaghetti with meat sauce Milkshakes  Cooked cereal Cold dry cereals (no nuts, dried fruit, coconut)  [place] toast or pancakes Crackers  Buttered toast Fruit juices or nectars  Noodles or pasta (no rice) Canned fruit  Potatoes (all types) Ripe bananas  Soft, cooked vegetables (no corn, lima, or baked beans) Peeled, ripe, fresh fruit  Creamed soups or vegetable soup Cakes  (no nuts, dried fruit, coconut)  Canned chicken noodle soup Plain doughnuts  Gravies Ice cream  Bacon dressing Pudding, mousse, custard  Sauces:  Cheese, creamed, barbecue, tomato, white Fruit ice, [place] ice, sherbet  Decaffeinated tea or coffee Whipped gelatin  Pork chops Regular gelatin   Canned fruited gelatin molds   Sugar, syrup, honey, jam, jelly   Cream   Non-dairy   Margarine   Oil   Mayonnaise   Ketchup   Mustard   TROUBLESHOOTING IRREGULAR BOWELS  1) Avoid extremes of bowel movements (no bad constipation/diarrhea)  2) Miralax 17gm mixed in 8oz. water or juice-daily. May use BID as needed.  3) Gas-x,Phazyme, etc. as needed for gas & bloating.  4) Soft,bland diet. No spicy,greasy,fried foods.  5) Prilosec over-the-counter as needed  6) May hold gluten/wheat products from diet to see if symptoms improve.  7) May try probiotics (Align, Activa, etc) to help calm the bowels down  7) If symptoms become worse call back immediately.    If you have any questions please call our office at [place]: [phone].

## 2022-02-07 NOTE — Anesthesia Postprocedure Evaluation (Signed)
Anesthesia Post Note  Patient: Jenna Bentley  Procedure(s) Performed: XI ROBOTIC ASSISTED PARAESOPHAGEAL HERNIA REPAIR WITH PARTIAL FUNDOPLICATION WITH TAP BLOCKS UPPER GI ENDOSCOPY     Patient location during evaluation: PACU Anesthesia Type: General Level of consciousness: awake and alert Pain management: pain level controlled Vital Signs Assessment: post-procedure vital signs reviewed and stable Respiratory status: spontaneous breathing, nonlabored ventilation and respiratory function stable Cardiovascular status: blood pressure returned to baseline and stable Postop Assessment: no apparent nausea or vomiting Anesthetic complications: no   No notable events documented.  Last Vitals:  Vitals:   02/07/22 1345 02/07/22 1400  BP: 123/76 116/67  Pulse: 81 84  Resp: 16 12  Temp:  36.6 C  SpO2: 93% 94%    Last Pain:  Vitals:   02/07/22 1400  TempSrc:   PainSc: 0-No pain                 Minaal Struckman,W. EDMOND

## 2022-02-07 NOTE — Anesthesia Procedure Notes (Signed)
Procedure Name: Intubation Date/Time: 02/07/2022 10:07 AM  Performed by: Maxwell Caul, CRNAPre-anesthesia Checklist: Patient identified, Emergency Drugs available, Suction available and Patient being monitored Patient Re-evaluated:Patient Re-evaluated prior to induction Oxygen Delivery Method: Circle system utilized Preoxygenation: Pre-oxygenation with 100% oxygen Induction Type: IV induction Ventilation: Mask ventilation without difficulty Laryngoscope Size: Mac and 4 Grade View: Grade I Tube type: Oral Tube size: 7.0 mm Number of attempts: 1 Airway Equipment and Method: Stylet Placement Confirmation: ETT inserted through vocal cords under direct vision, positive ETCO2 and breath sounds checked- equal and bilateral Secured at: 22 cm Tube secured with: Tape Dental Injury: Teeth and Oropharynx as per pre-operative assessment

## 2022-02-07 NOTE — Transfer of Care (Signed)
Immediate Anesthesia Transfer of Care Note  Patient: Jenna Bentley  Procedure(s) Performed: XI ROBOTIC ASSISTED PARAESOPHAGEAL HERNIA REPAIR WITH PARTIAL FUNDOPLICATION WITH TAP BLOCKS UPPER GI ENDOSCOPY  Patient Location: PACU  Anesthesia Type:General  Level of Consciousness: awake, alert  and oriented  Airway & Oxygen Therapy: Patient Spontanous Breathing and Patient connected to face mask oxygen  Post-op Assessment: Report given to RN and Post -op Vital signs reviewed and stable  Post vital signs: Reviewed and stable  Last Vitals:  Vitals Value Taken Time  BP 133/79 02/07/22 1321  Temp    Pulse 82 02/07/22 1324  Resp 14 02/07/22 1324  SpO2 100 % 02/07/22 1324  Vitals shown include unvalidated device data.  Last Pain:  Vitals:   02/07/22 0748  TempSrc:   PainSc: 0-No pain         Complications: No notable events documented.

## 2022-02-07 NOTE — Op Note (Signed)
02/07/2022  1:17 PM  PATIENT:  Jenna Bentley  71 y.o. 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  She would have discomfort while eating.  She would get full on a very small amount of food.  She also complained of worsening heartburn.  She will also start having coughing.  She was taking omeprazole and Mylanta multiple times a day.  If she over ate she would have significant pressure in her upper abdomen and discomfort.  She would have to use a wedge pillow to sleep on.  She had an upper endoscopy that showed a moderate size hiatal hernia.  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  Esophageal manometry was normal.  We discussed robotic repair with possible insertion of absorbable mesh depending on how well with the the diaphragmatic closure was able to be performed.  We also discussed partial wrap versus full wraps.  After discussion the patient elected to undergo a partial fundoplication.  PROCEDURE: The patient received 5000 units of subcutaneous heparin preoperatively.  She also received oral Tylenol.  She voided prior to going to the operating room.  Taken to the OR 5 at [facility] placed upon on the operating room table.  General endotracheal anesthesia was established.  Sequential compression devices were placed.  Her arms were tucked at her side.  Her  abdomen was prepped and draped in the usual standard surgical fashion.  A surgical timeout was performed.  IV antibiotic was administered.  Access to the abdomen was was achieved using the Optiview technique.  A small incision was made in the left mid abdomen about 8 cm to the left of the umbilicus.  Then using a 0 degree 5 mm laparoscope through a 5 mm trocar I advanced the laparoscope through all layers of abdominal wall and carefully entered the abdominal cavity.  Pneumoperitoneum was smoothly established to a patient pressure of 15 mmHg without any change in patient vital signs.  Patient was placed in reverse Trendelenburg.  An 8 mm robotic trocar was placed slightly to the left of the umbilicus.  Another 8 mm trocar in the right midabdomen and assistant trocar which was 5 mm in the right lateral abdominal wall.  The Optiview trocar was exchanged for an 8 mm robotic and a final 8 mm robotic trocars placed in the left lateral abdominal wall all under direct visualization.  A bilateral laparoscopic tap block was performed for postoperative pain relief.  A Nathanson liver retractor was placed through the subxiphoid incision to lift up the left lobe of the liver.  The surgical robot was then brought to the OR table and docked.  Fenestrated bipolar was placed in arm 1, vessel sealer in arm 3 and tip up grasper in arm 4.  I then went to the surgeon console.  My assistant stayed at the bedside and assisted with retraction of tissue and instrument exchange.  Patient had approximately 5  cm defect in the diaphragm.  She had evidence of paraesophageal hiatal hernia.  The peritoneum was incised along the anterior diaphragm but it was rather thick and I was not getting into the correct plane so I decided to start on the right side.  The gastrohepatic ligament was incised with vessel sealer.  The right crus the diaphragm was identified.  I gently incised it slightly medial to this and got into the avascular plane started  taking it up toward the superior part of the right crus.  This was done with a combination of gentle blunt dissection along with vessel sealer taking down the attachments were needed.  Able to get into the mediastinal cavity on the right.  We were able to start taking down the mobilizing the hernia sac on the right.  I continued this across anteriorly.  The esophagus was identified along with the aorta.  I continued this across the anterior portion of the diaphragm.  We then turned our attention along the greater curvature the stomach.  Short gastrics were taken down about 8 cm from the cardia.  I got the lesser sac and continue to take down the short gastrics all the way up to the left crus of the diaphragm with the vessel sealer.  My assistant was helping retract the stomach over to the right to help facilitate visualization.  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  I was able to continue mobilization of the hernia sac from the anterior diaphragm over to the left crus.  At this time I was able to pass the fenestrated bipolar retrogastric and come behind the stomach we then placed a Penrose drain in the upper abdomen and I was able to pass it retrogastric to help retract the distal esophagus and upper stomach.  Then returned to the mediastinum and continued circumferential mobilization of the esophagus.  We have identified the posterior vagus along with the anterior vagus.  The aorta appeared a little bit dilated and the posterior esophagus was a little bit more adhered to the aorta than typical.  It was not densely adhered it was just a little bit more inflamed/chronic inflammation.  I continued mobilization of the esophagus in a circumferential manner probably for about 7 or 8 cm up into the mediastinum.  Started coming across some lymph nodes and at that point I decided  to stop mobilization.  At this time I went and got the Olympus endoscope and placed it in the patient's oropharynx and gently glided it down the esophagus.  There is no evidence of esophageal injury or mucosal injury.  We visualized the endoscope passing down on the robotic camera.  Identified the Z-line.  It was about 38 cm from the incisors.  We transilluminated.  The Z-line was about 2-1/2 cm below the diaphragm.  I then slowly pulled back the endoscope and reinspected the esophagus again it appeared grossly normal.  I desufflated the stomach and pulled back on the endoscope and removed it.  I then went back to the robotic surgeon's console there were a few posterior attachments left along the left side of the aorta which were taken down with vessel sealer.  I then performed additional endoscopy to revisualize the esophagus.  Again no evidence of intramural injury.  There is no air within the mediastinum escaping from the esophagus.  The  Z-line was now about 3 cm below the diaphragm.  The scope was withdrawn.  I went back to the console and started reapproximating the left and right crura.  Pneumoperitoneum was reduced to 10 mmHg.  I placed 5 interrupted 0 Ethibond sutures between the left and right crura.  The crura came together without tension.  This left a small gap around the esophagus at the hiatus.  I then performed a toupee fundoplication.  The fundus of the stomach was then brought back around the esophagus to the right side.  Shoeshine maneuver was performed to ensure that the wrap was not twisted and we had the correct orientation.  3 sutures were placed between the esophagus and the fundus on the right side.  These consisted of 0 Ethibond's.  In a similar fashion 3 sutures were placed on the left side between the esophagus and the distal fundus.  I then performed 1 last endoscopy.  Endoscope was advanced down the esophagus again mucosa appeared normal.  Z-line 3 cm below the diaphragmatic closure.   I then insufflated the stomach and retroflexed and visualized the hiatal closure as well as the partial wrap which appeared to be intact in the proper orientation.  Stomach was desufflated and the endoscope was removed.  I scrubbed back in.  Prior to closing the crura we had excised the hernia sac from the distal esophagus and proximal stomach into 2 pieces.  A Ecco sac was placed in the abdominal cavity and the hernia sac was placed within the Ecco sac and extracted from the right mid abdomen.  Extraction site was closed with 2 interrupted 0 Vicryl's with the PMI suture passer using laparoscopic guidance.  The [facility] liver retractor was removed.  There is no evidence of injury to the liver or surrounding structures.  Pneumoperitoneum was released and the trocars were removed.  Skin incisions were closed with a 4 Monocryl followed by application of Steri-Strips, 2 x 2's and Tegaderms.  All needle, instrument sponge counts were correct x 2.  There were no immediate complications.  PLAN OF CARE: Admit for overnight observation  PATIENT DISPOSITION:  PACU - hemodynamically stable.   Delay start of Pharmacological VTE agent (>24hrs) due to surgical blood loss or risk of bleeding:  no  Leighton Ruff. Redmond Pulling, MD, FACS General, Bariatric, & Minimally Invasive Surgery [facility], [place]

## 2022-02-08 ENCOUNTER — Observation Stay (HOSPITAL_COMMUNITY): Payer: Medicare HMO

## 2022-02-08 ENCOUNTER — Encounter (HOSPITAL_COMMUNITY): Payer: Self-pay | Admitting: General Surgery

## 2022-02-08 DIAGNOSIS — K449 Diaphragmatic hernia without obstruction or gangrene: Secondary | ICD-10-CM | POA: Diagnosis not present

## 2022-02-08 LAB — BASIC METABOLIC PANEL
Anion gap: 8 (ref 5–15)
BUN: 19 mg/dL (ref 8–23)
CO2: 24 mmol/L (ref 22–32)
Calcium: 8.8 mg/dL — ABNORMAL LOW (ref 8.9–10.3)
Chloride: 104 mmol/L (ref 98–111)
Creatinine, Ser: 1.08 mg/dL — ABNORMAL HIGH (ref 0.44–1.00)
GFR, Estimated: 56 mL/min — ABNORMAL LOW (ref >60)
Glucose, Bld: 164 mg/dL — ABNORMAL HIGH (ref 70–99)
Potassium: 4.1 mmol/L (ref 3.5–5.1)
Sodium: 136 mmol/L (ref 135–145)

## 2022-02-08 LAB — CBC
HCT: 33.5 % — ABNORMAL LOW (ref 36.0–46.0)
Hemoglobin: 11.8 g/dL — ABNORMAL LOW (ref 12.0–15.0)
MCH: 31.1 pg (ref 26.0–34.0)
MCHC: 35.2 g/dL (ref 30.0–36.0)
MCV: 88.2 fL (ref 80.0–100.0)
Platelets: 176 10*3/uL (ref 150–400)
RBC: 3.8 MIL/uL — ABNORMAL LOW (ref 3.87–5.11)
RDW: 12 % (ref 11.5–15.5)
WBC: 11.4 10*3/uL — ABNORMAL HIGH (ref 4.0–10.5)
nRBC: 0 % (ref 0.0–0.2)

## 2022-02-08 LAB — MAGNESIUM: Magnesium: 1.8 mg/dL (ref 1.7–2.4)

## 2022-02-08 MED ORDER — IOHEXOL 300 MG/ML  SOLN
100.0000 mL | Freq: Once | INTRAMUSCULAR | Status: AC | PRN
  Administered 2022-02-08: 100 mL via ORAL

## 2022-02-08 MED ORDER — ONDANSETRON 4 MG PO TBDP: 4.0000 mg | ORAL_TABLET | Freq: Four times a day (QID) | ORAL | 0 refills | Status: AC | PRN

## 2022-02-08 MED ORDER — OXYCODONE HCL 5 MG PO TABS
5.0000 mg | ORAL_TABLET | Freq: Four times a day (QID) | ORAL | 0 refills | Status: AC | PRN
Start: 2022-02-08 — End: ?

## 2022-02-08 NOTE — Progress Notes (Addendum)
1 Day Post-Op Robotic PAH repair Subjective: No complaints, tolerating clears  Objective: Vital signs in last 24 hours: Temp:  [97.4 F (36.3 C)-98.8 F (37.1 C)] 98.7 F (37.1 C) (08/12 0834) Pulse Rate:  [68-87] 68 (08/12 0834) Resp:  [12-18] 18 (08/12 0834) BP: (113-150)/(67-81) 131/80 (08/12 0834) SpO2:  [93 %-100 %] 98 % (08/12 0834)   Intake/Output from previous day: 08/11 0701 - 08/12 0700 In: 3821.2 [P.O.:220; I.V.:3501.2; IV Piggyback:100] Out: 25 [Blood:25] Intake/Output this shift: Total I/O In: -  Out: 100 [Urine:100]   General appearance: alert and cooperative GI: normal findings: soft, non-distended  Incision: no significant drainage  Lab Results:  Recent Labs    02/08/22 0415  WBC 11.4*  HGB 11.8*  HCT 33.5*  PLT 176   BMET Recent Labs    02/08/22 0415  NA 136  K 4.1  CL 104  CO2 24  GLUCOSE 164*  BUN 19  CREATININE 1.08*  CALCIUM 8.8*   PT/INR No results for input(s): "LABPROT", "INR" in the last 72 hours. ABG No results for input(s): "PHART", "HCO3" in the last 72 hours.  Invalid input(s): "PCO2", "PO2"  MEDS, Scheduled  acetaminophen  1,000 mg Oral Q8H   amLODipine  2.5 mg Oral Daily   heparin injection (subcutaneous)  5,000 Units Subcutaneous Q8H   irbesartan  300 mg Oral Daily   And   hydrochlorothiazide  25 mg Oral Daily   pantoprazole (PROTONIX) IV  40 mg Intravenous QHS    Studies/Results: No results found.  Assessment: s/p Procedure(s): XI ROBOTIC ASSISTED PARAESOPHAGEAL HERNIA REPAIR WITH PARTIAL FUNDOPLICATION WITH TAP BLOCKS UPPER GI ENDOSCOPY Patient Active Problem List   Diagnosis Date Noted   S/P repair of paraesophageal hernia 02/07/2022   Dysphagia    Hiatal hernia    Benign hypertension with CKD (chronic kidney disease) stage III ([place]) 10/16/2020   Anxiety 11/20/2015   GERD (gastroesophageal reflux disease) 11/20/2015   HTN (hypertension) 11/20/2015   Hyperlipemia 11/20/2015    Expected post op  course  Plan: Advance diet to fulls  UGI swallow eval today  Discharge today, if pt continues to progress    LOS: 0 days     .Rosario Adie, MD [facility], [place]    02/08/2022 9:07 AM

## 2022-02-08 NOTE — Discharge Summary (Signed)
Patient ID: Jenna Bentley [id number] y.o. [birth year]  02/07/2022  Discharge date and time: 02/08/2022  1:40 PM  Admitting Physician: Rosario Adie  Discharge Physician: Rosario Adie  Admission Diagnoses: S/P repair of paraesophageal hernia [Z98.890, Z87.19]  Discharge Diagnoses: paraesophageal hernia  Operations: Procedure(s): XI ROBOTIC ASSISTED PARAESOPHAGEAL HERNIA REPAIR WITH PARTIAL FUNDOPLICATION WITH TAP BLOCKS UPPER GI ENDOSCOPY    Discharged Condition: good    Hospital Course: Patient did well with overnight observation  Consults: None  Significant Diagnostic Studies: radiology:  UGI  Treatments: IV hydration, analgesia: acetaminophen, and surgery: robotic PAH repair  Disposition: Home

## 2022-03-11 ENCOUNTER — Other Ambulatory Visit: Payer: Self-pay | Admitting: Family Medicine

## 2022-03-11 DIAGNOSIS — Z1231 Encounter for screening mammogram for malignant neoplasm of breast: Secondary | ICD-10-CM

## 2022-03-28 ENCOUNTER — Ambulatory Visit
Admission: RE | Admit: 2022-03-28 | Discharge: 2022-03-28 | Disposition: A | Discharge status: Home / Self Care | Payer: Medicare HMO | Source: Ambulatory Visit | Attending: Family Medicine | Admitting: Family Medicine

## 2022-03-28 DIAGNOSIS — Z1231 Encounter for screening mammogram for malignant neoplasm of breast: Secondary | ICD-10-CM

## 2022-05-09 ENCOUNTER — Other Ambulatory Visit: Payer: Self-pay | Admitting: Family Medicine

## 2022-05-09 DIAGNOSIS — E2839 Other primary ovarian failure: Secondary | ICD-10-CM

## 2022-06-05 IMAGING — CR DG UGI W/ HIGH DENSITY W/O KUB
13 of 19 series · 14 of 24 positions shown · non-contrast
Comparison: CT abdomen 02/28/2010

CLINICAL DATA: Paraesophageal hernia

EXAM:
UPPER GI SERIES WITH KUB
TECHNIQUE: After obtaining a scout radiograph a routine upper GI series was
performed using thin and high density barium.
FLUOROSCOPY:
Fluoroscopy Time:  4 minutes, 12 seconds
Radiation Exposure Index (if provided by the fluoroscopic device):
33 mGy air kerma
Number of Acquired Spot Images: 5

[t abdomen]
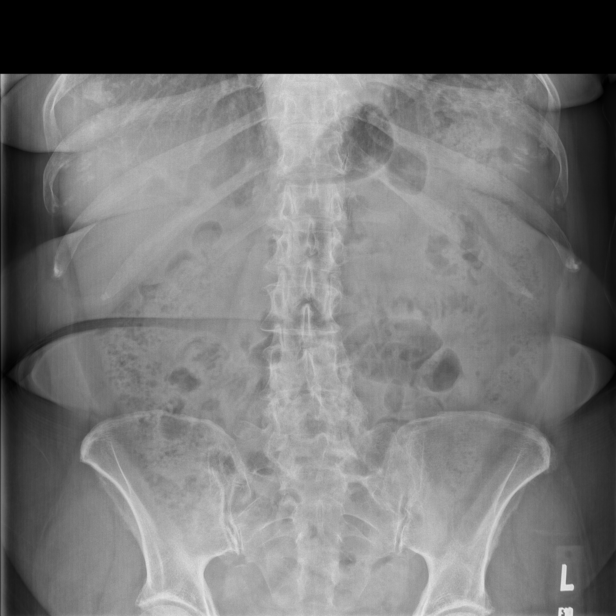

[cp_standard (1 of 11) · tomo slice 6/36.0]
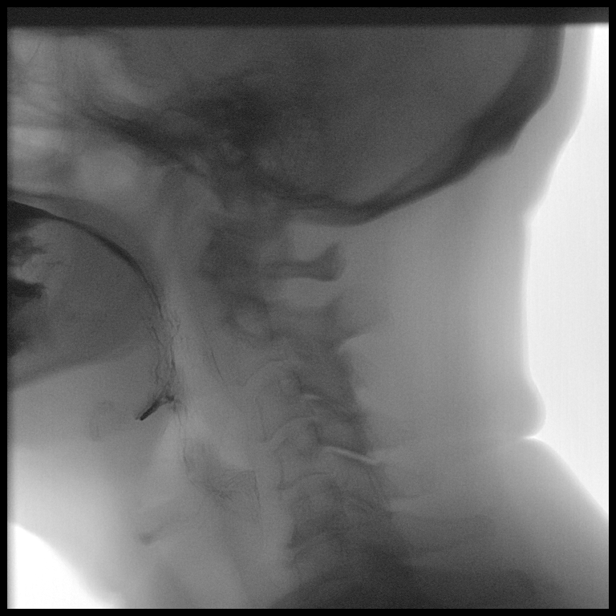

[cp_standard (2 of 11) · tomo slice 87/102.0]
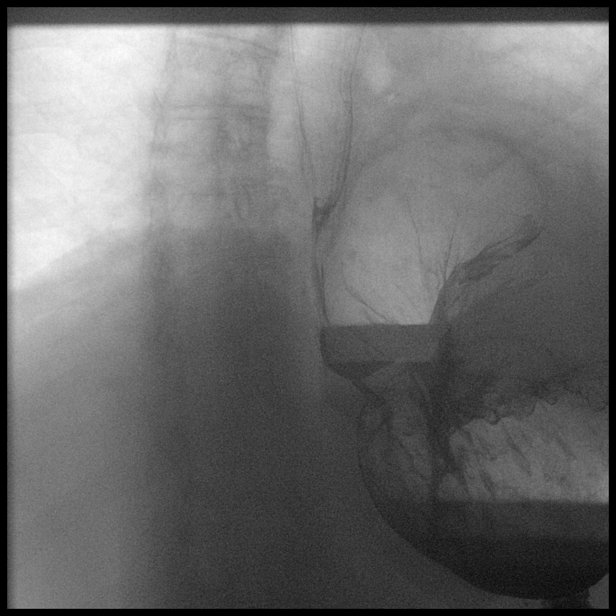

[Series 8: cp_standard · 2 of 20 frames shown (3 of 11)]
[frame 4/20]
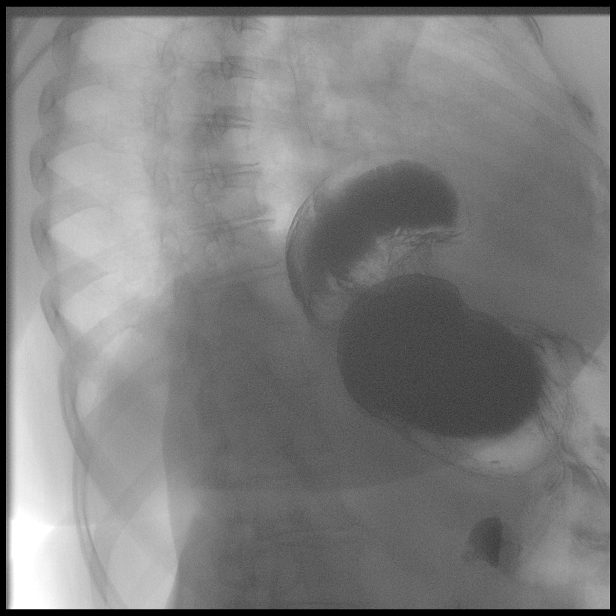
[frame 18/20]
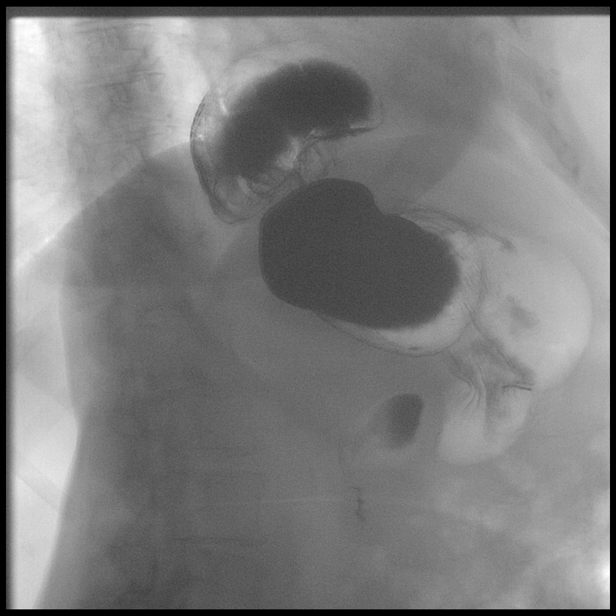

[cp_standard (4 of 11) · tomo slice 2/14.0]
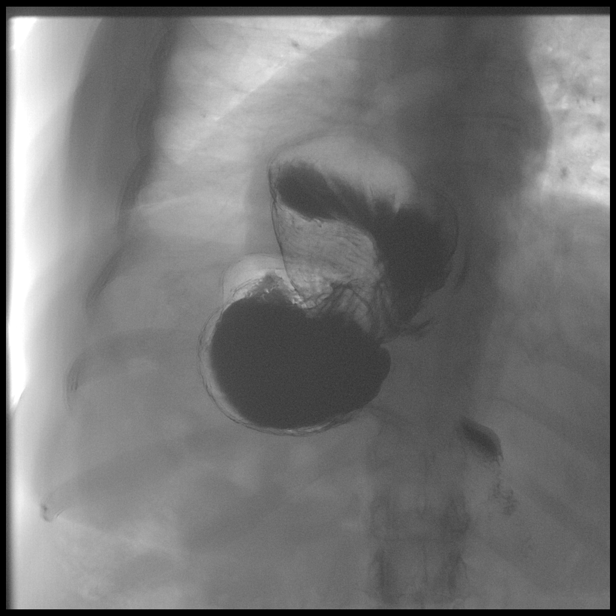

[cp_standard (5 of 11) · tomo slice 7/12.0]
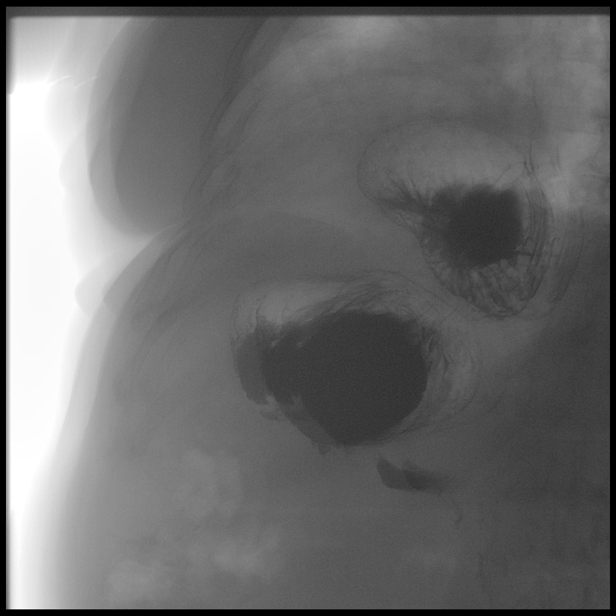

[fluoro_barium 2fps_bw]
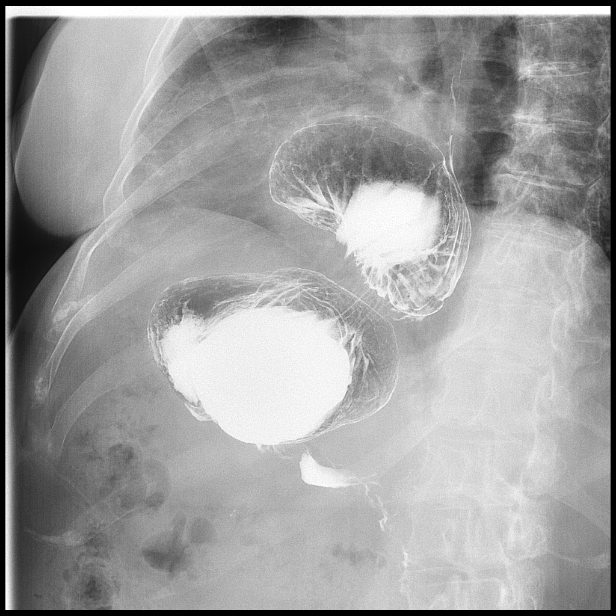

[cp_standard (6 of 11) · tomo slice 16/42.0]
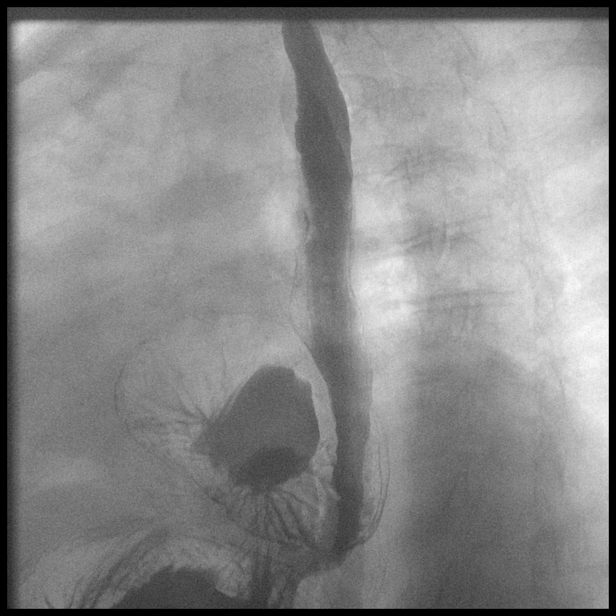

[cp_standard (7 of 11) · tomo slice 6/38.0]
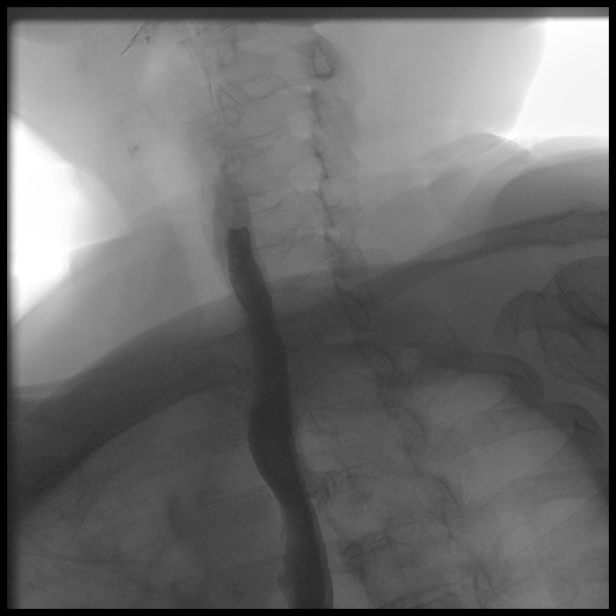

[cp_standard (8 of 11) · tomo slice 25/48.0]
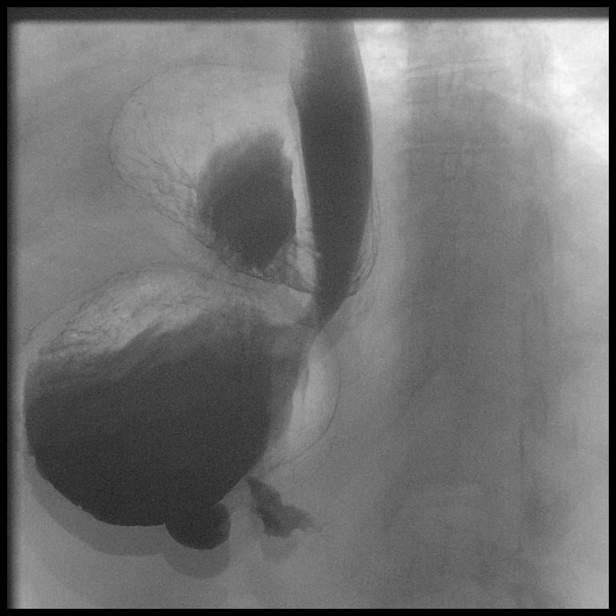

[cp_standard (9 of 11) · tomo slice 16/30.0]
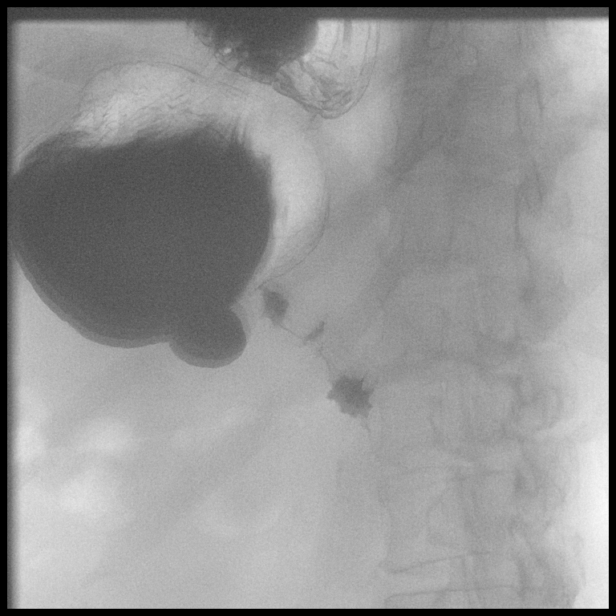

[cp_standard (10 of 11) · tomo slice 3/16.0]
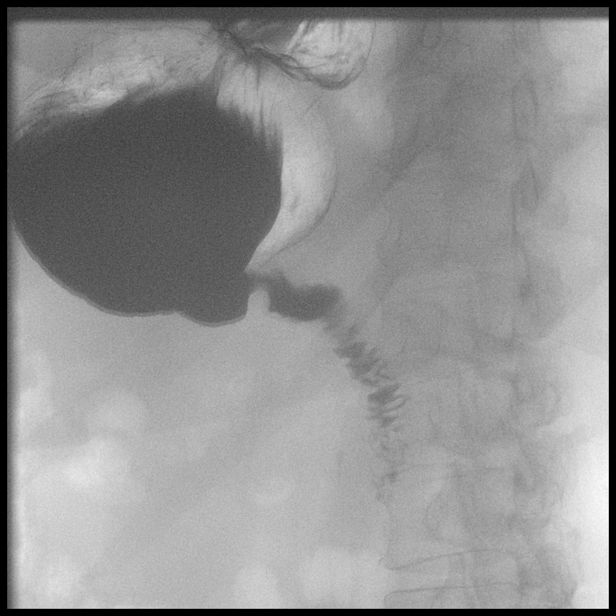

[cp_standard (11 of 11) · tomo slice 10/11.0]
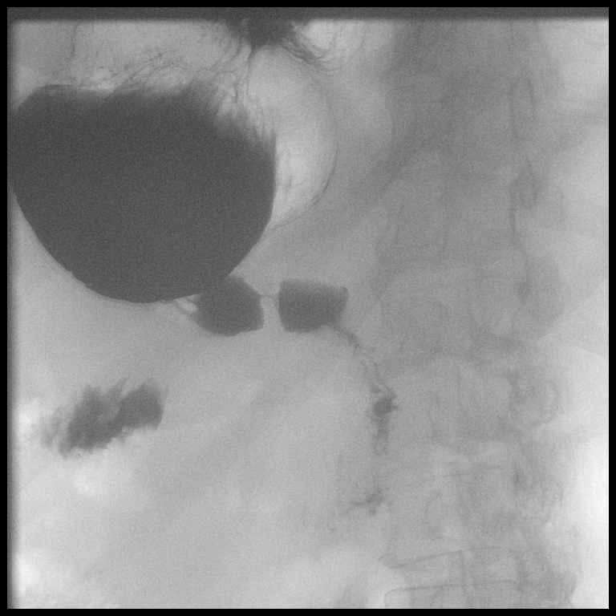

[14 of 24 positions shown; findings below may reference images not displayed]

FINDINGS: Initial KUB demonstrates signs of hiatal hernia but otherwise
unremarkable bowel gas pattern.

The pharyngeal phase of swallowing appears normal. Note is made of
degenerative disc disease and spondylosis at the C5-6 level with
loss of intervertebral disc height.

Primary peristaltic waves in the esophagus were intact on all
swallows.

Distal esophageal mucosal ring in the esophagus as on image 32 of
series 20. Esophageal lumen about 1.3 cm in diameter in the vicinity
of this ring, which could potentially cause symptoms, although a
cm barium tablet did pass beyond the ring without stasis.

There is a moderate to large paraesophageal (type 2) hiatal hernia
containing about 30% of the stomach.

The remainder of the stomach appears unremarkable. The duodenum
appears normal. No malrotation.
IMPRESSION: 1. Type 2 paraesophageal hernia contains about 30% of the stomach.
2. Distal esophageal mucosal ring causing focal narrowing of the
distal esophagus down to about 1.3 cm in diameter, although a 13 mm
barium tablet did pass beyond the ring without stasis.
3. Degenerative disc disease at C5-6.

## 2022-12-25 ENCOUNTER — Ambulatory Visit
Admission: RE | Admit: 2022-12-25 | Discharge: 2022-12-25 | Disposition: A | Discharge status: Home / Self Care | Payer: Medicare HMO | Source: Ambulatory Visit | Attending: Family Medicine | Admitting: Family Medicine

## 2022-12-25 DIAGNOSIS — E2839 Other primary ovarian failure: Secondary | ICD-10-CM

## 2023-03-06 ENCOUNTER — Other Ambulatory Visit: Payer: Self-pay | Admitting: Family Medicine

## 2023-03-06 DIAGNOSIS — Z1231 Encounter for screening mammogram for malignant neoplasm of breast: Secondary | ICD-10-CM

## 2023-04-01 ENCOUNTER — Ambulatory Visit
Admission: RE | Admit: 2023-04-01 | Discharge: 2023-04-01 | Disposition: A | Discharge status: Home / Self Care | Payer: Medicare HMO | Source: Ambulatory Visit | Attending: Family Medicine | Admitting: Family Medicine

## 2023-04-01 DIAGNOSIS — Z1231 Encounter for screening mammogram for malignant neoplasm of breast: Secondary | ICD-10-CM

## 2024-03-17 ENCOUNTER — Other Ambulatory Visit: Payer: Self-pay | Admitting: Family Medicine

## 2024-03-17 DIAGNOSIS — Z1231 Encounter for screening mammogram for malignant neoplasm of breast: Secondary | ICD-10-CM

## 2024-04-01 ENCOUNTER — Ambulatory Visit

## 2024-04-18 ENCOUNTER — Ambulatory Visit
Admission: RE | Admit: 2024-04-18 | Discharge: 2024-04-18 | Disposition: A | Discharge status: Home / Self Care | Source: Ambulatory Visit | Attending: Family Medicine | Admitting: Family Medicine

## 2024-04-18 DIAGNOSIS — Z1231 Encounter for screening mammogram for malignant neoplasm of breast: Secondary | ICD-10-CM
